# Patient Record
Sex: Male | Born: 1945
Health system: Southern US, Community
[De-identification: ages and names within clinical notes are randomized; demographics above are authoritative.]

## PROBLEM LIST (undated history)

## (undated) DIAGNOSIS — E785 Hyperlipidemia, unspecified: Secondary | ICD-10-CM

## (undated) DIAGNOSIS — Z803 Family history of malignant neoplasm of breast: Secondary | ICD-10-CM

## (undated) DIAGNOSIS — M1990 Unspecified osteoarthritis, unspecified site: Secondary | ICD-10-CM

## (undated) DIAGNOSIS — C61 Malignant neoplasm of prostate: Secondary | ICD-10-CM

## (undated) DIAGNOSIS — D72829 Elevated white blood cell count, unspecified: Secondary | ICD-10-CM

## (undated) DIAGNOSIS — H52209 Unspecified astigmatism, unspecified eye: Secondary | ICD-10-CM

## (undated) DIAGNOSIS — I10 Essential (primary) hypertension: Secondary | ICD-10-CM

## (undated) DIAGNOSIS — R945 Abnormal results of liver function studies: Secondary | ICD-10-CM

## (undated) DIAGNOSIS — H524 Presbyopia: Secondary | ICD-10-CM

## (undated) DIAGNOSIS — R7989 Other specified abnormal findings of blood chemistry: Secondary | ICD-10-CM

## (undated) DIAGNOSIS — G4733 Obstructive sleep apnea (adult) (pediatric): Secondary | ICD-10-CM

## (undated) HISTORY — DX: Elevated white blood cell count, unspecified: D72.829

## (undated) HISTORY — PX: APPENDECTOMY: SHX54

## (undated) HISTORY — PX: OTHER SURGICAL HISTORY: SHX169

## (undated) HISTORY — DX: Essential (primary) hypertension: I10

## (undated) HISTORY — DX: Unspecified osteoarthritis, unspecified site: M19.90

## (undated) HISTORY — PX: TONSILLECTOMY: SUR1361

## (undated) HISTORY — DX: Other specified abnormal findings of blood chemistry: R79.89

## (undated) HISTORY — DX: Family history of malignant neoplasm of breast: Z80.3

## (undated) HISTORY — DX: Obstructive sleep apnea (adult) (pediatric): G47.33

## (undated) HISTORY — PX: LUMBAR DISC SURGERY: SHX700

## (undated) HISTORY — DX: Abnormal results of liver function studies: R94.5

---

## 2006-01-10 ENCOUNTER — Emergency Department (HOSPITAL_COMMUNITY): Admission: EM | Admit: 2006-01-10 | Discharge: 2006-01-10 | Payer: Self-pay | Admitting: Emergency Medicine

## 2006-01-27 ENCOUNTER — Encounter: Admission: RE | Admit: 2006-01-27 | Discharge: 2006-01-27 | Payer: Self-pay | Admitting: Orthopedic Surgery

## 2006-02-12 ENCOUNTER — Encounter: Admission: RE | Admit: 2006-02-12 | Discharge: 2006-02-12 | Payer: Self-pay | Admitting: Specialist

## 2006-03-08 ENCOUNTER — Encounter: Admission: RE | Admit: 2006-03-08 | Discharge: 2006-03-08 | Payer: Self-pay | Admitting: Orthopedic Surgery

## 2006-03-30 ENCOUNTER — Ambulatory Visit (HOSPITAL_COMMUNITY): Admission: RE | Admit: 2006-03-30 | Discharge: 2006-03-31 | Payer: Self-pay | Admitting: Specialist

## 2006-07-11 ENCOUNTER — Encounter: Admission: RE | Admit: 2006-07-11 | Discharge: 2006-07-11 | Payer: Self-pay | Admitting: Specialist

## 2006-07-30 ENCOUNTER — Encounter: Admission: RE | Admit: 2006-07-30 | Discharge: 2006-07-30 | Payer: Self-pay | Admitting: Specialist

## 2006-08-27 ENCOUNTER — Encounter: Admission: RE | Admit: 2006-08-27 | Discharge: 2006-08-27 | Payer: Self-pay | Admitting: Specialist

## 2006-09-17 ENCOUNTER — Encounter: Admission: RE | Admit: 2006-09-17 | Discharge: 2006-09-17 | Payer: Self-pay | Admitting: Specialist

## 2007-07-30 ENCOUNTER — Emergency Department (HOSPITAL_COMMUNITY): Admission: EM | Admit: 2007-07-30 | Discharge: 2007-07-30 | Payer: Self-pay | Admitting: Emergency Medicine

## 2007-08-21 IMAGING — CR DG CHEST 2V
2 series · 2 of 2 positions shown · non-contrast
Comparison: None.

CLINICAL DATA: Preadmission for back surgery.
 CHEST ? 2 VIEW:

[view not recorded (1 of 2)]
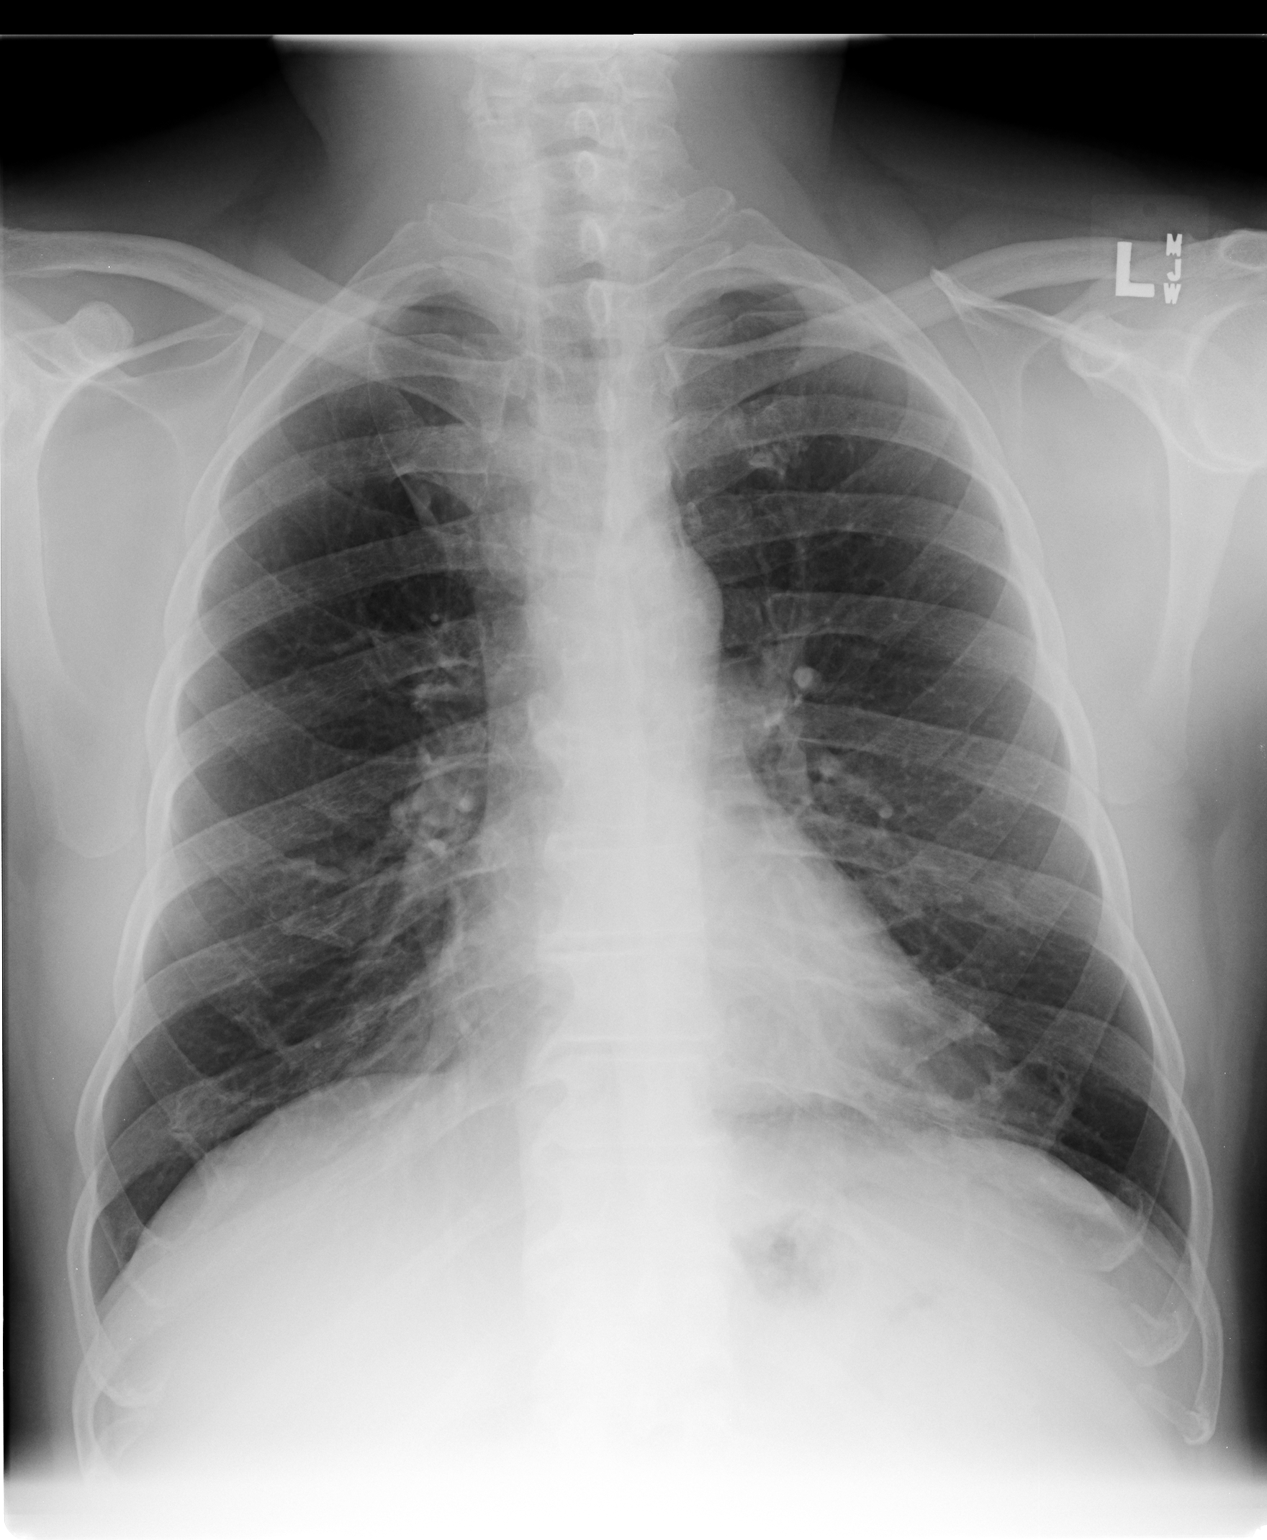

[view not recorded (2 of 2)]
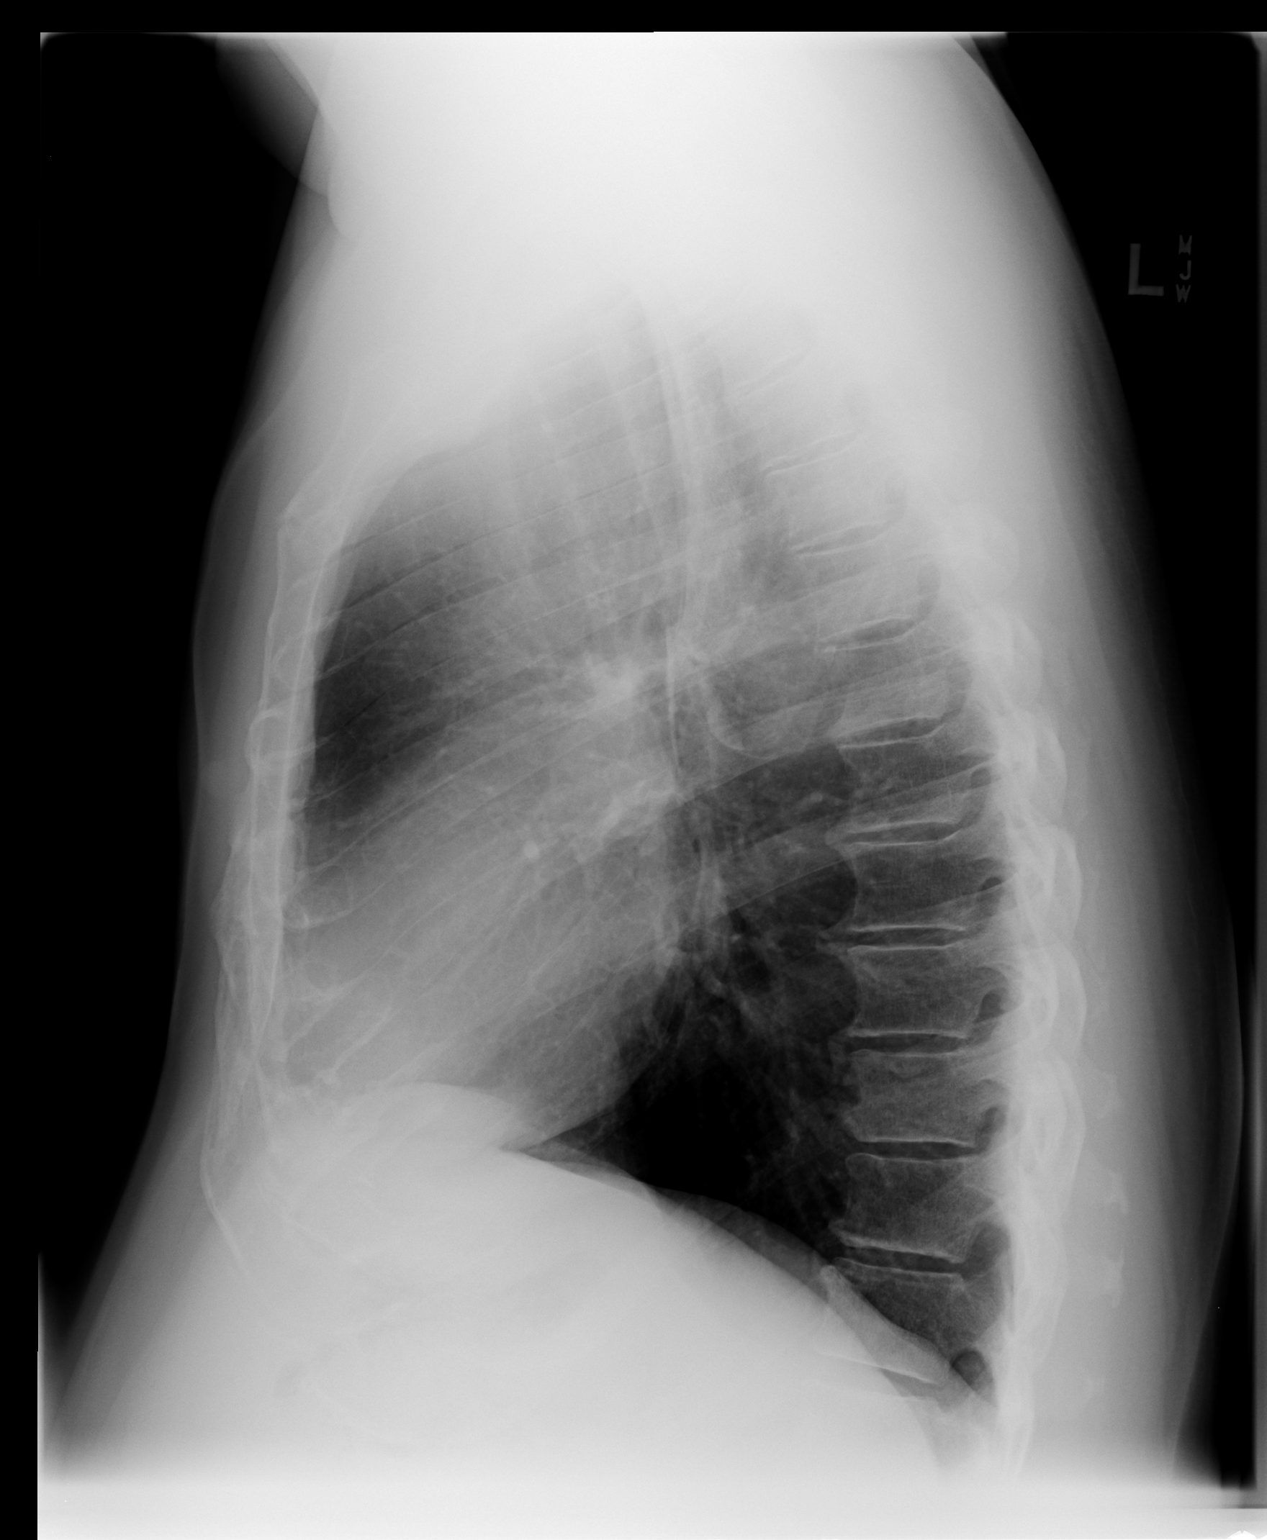

[2 of 2 positions shown; findings below may reference images not displayed]

FINDINGS: There is mild hyperinflation and changes of mild COPD.  There is no heart failure, infiltrate or effusion.  The lungs are clear.
IMPRESSION: Mild COPD.  No acute abnormality.

## 2010-08-29 NOTE — Op Note (Signed)
NAMEJONNIE, TRUXILLO NO.:  [id number]   MEDICAL RECORD NO.:  [id number]          PATIENT TYPE:  OIB   LOCATION:  5018                         FACILITY:  [facility]   PHYSICIAN:  Kerrin Champagne, M.D.   DATE OF BIRTH:  11/03/1945   DATE OF PROCEDURE:  03/30/2006  DATE OF DISCHARGE:  03/31/2006                               OPERATIVE REPORT   PREOPERATIVE DIAGNOSIS:  Herniated nucleus pulposus, central and right-  sided, L4-L5.   POSTOPERATIVE DIAGNOSIS:  Herniated nucleus pulposus, central and right-  sided, L4-L5.   PROCEDURE PERFORMED:  Microdiskectomy, right L4-L5, using the minimally-  invasive surgery system.   SURGEON:  Kerrin Champagne, M.D.   ASSISTANT:  Maud Deed, P.A.-C.   ANESTHESIA:  General via orotracheal intubation.  Dr. Krista Blue  supplemented with local infiltration of Marcaine 0.5% with 1:200,000  epinephrine 10 mL.   SPECIMENS:  HNP not sent for pathology.   ESTIMATED BLOOD LOSS:  10 mL.   COMPLICATIONS:  None.  The patient returned to the PACU in good  condition.   HISTORY OF PRESENT ILLNESS:  A 65 year old male with a 2-1/2 month  history of back pain and radiation to the right leg.  He has persistent  right foot dorsiflexion and weakness.  He has a positive straight leg  raise at 45 degrees.  MRI scan with HNP, central and right-sided L4-L5.  He has undergone conservative management, including physical therapy,  epidural steroids x3.  Persistent back pain.  He is continuing to  utilize narcotic medicines and is unable to return to work.  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  He was brought to the operating  room to undergo a right-sided L4-L5 microdiskectomy for the persistent  pain and disability, persistent radiculopathy.   INTRAOPERATIVE FINDINGS:  The patient had protrusion of  disk, central  and right-sided, a subligamentous and annular protrusion, but no free  fragments noted within the spinal canal.  The patient underwent  decompression in both the L4 and the L5 nerve roots.   DESCRIPTION OF PROCEDURE:  After adequate general anesthesia, the  patient on a Wilson frame, a reverse Skytron bed was used, as per C-arm  fluoro.  Standard preoperative antibiotics of Ancef, standard prep with  DuraPrep solution, draped in the usual manner.  The area for expected L4-  L5 level carefully marked out at the upper end of the patient's crest.  Spinal needle then placed approximately an inch to the right side of the  midline at expected L4-L5 level.  Intraoperative radiograph demonstrated  the needle over the right L4-L5 facet.  Along his central portion and  slightly medial.  Incision was then made using a 10-blade scalpel,  approximately an inch in length, extending from above and below the  needle.  Carried down through the subcutaneous layers and the  superficial lumbodorsal fascia incised in line with the skin incision.  Spinal needle  was removed, and then using the C-arm, the smallest  dilator was then used to dock to the facet at the right L4-L5 level.  This was then used to carefully dissect the soft tissues off the medial  aspect of the facet.  Dilation was then performed up to the 7-mm dilator  and to the largest dilator possible.  The MIS retractor 3-blade was then  passed over the dilators down to the level of the facet, and then  carefully expanded in order to expose the posterior aspect of the  interlaminar space at the L4-L5 level.  A Penfield 4 was then passed  along the medial aspect of the facet.  Intraoperative lateral radiograph  demonstrating the Penfield 4 at the facet at the L4-L5 level, directly  posterior to the L4-L5 disk.  With this, then the C-arm fluoro was  removed from the field.  A 3-mm Kerrison was then used to resect a small  portion of  the inferior aspect of the lamina of L4 on the right side,  and then off the medial aspect of the facet at the L4-L5 level.  The  superior insertion of the ligamentum flavum at the L4-L5 level into the  L4 inferior aspect of the lamina was then carefully freed, and this was  then resected using a 3-mm Kerrison.  This resected off the superior  aspect of the lamina of L5.  And also from the medial aspect of the  facet at the L4-L5 level.  Loupe magnification and headlamp was used for  this portion of the case.  Operating room microscope was then carefully  draped and brought into the field.  Under the operating room microscope  then the dura was carefully evaluated as well as the L5 nerve root  examined.  The lateral aspect of the thecal sac and the L5 nerve root  carefully retracted medially with a Penfield 4 and a D'Errico retractor  placed.  The patient's veins were carefully cauterized with bipolar  electrocautery over the posterior aspect of the disk space.  And the  disk was then carefully probed, found to be protruding posteriorly and  into the right lateral recess.  A 15-blade scalpel was used to incise  the disk and then pituitary rongeur used to excise the disk.  Removing  disk material with the patient's pituitaries with teeth using upbiting  and downbiting pituitaries until the disk had been well-decompressed.  Hockey-stick neuro probe then passed over the posterior aspect of the  disk space of the L4-L5 level, demonstrating that the central portion of  the canal had been well-decompressed.  A hockey-stick neuro probe could  be passed out the L4 and L5 neural foramen, demonstrating their  patencies, and there was no remaining herniated disk here.  Small  bleeders were controlled using bipolar electrocautery.   Irrigation was performed.  Thrombin-soaked Gelfoam was used to obtain  hemostasis.   Following further irrigation then evaluation, an Epstein curette was used to free  up further disk material in a subligamentous fashion, and  then this was resected using pituitaries again.  Irrigation of the disk  space carried out.  Once this was completed, then the spinal canal  demonstrated no active bleeding present.  Soft tissues were allowed to  fall back into place and the retractor was resected and removed.  Once  the retractor was removed, the soft tissues allowed to fall back into  place, the lumbodorsal fascia was reapproximated with interrupted 0  Vicryl sutures  with a UR-6 needle.  Subcu layer was approximated with  interrupted 0 Vicryl sutures.  The subcutaneous layer reapproximated  with an interrupted 2-0 Vicryl.  The skin closed with a running subcu  stitch of 4-0 Vicryl and Dermabond applied.  A 4x4 was affixed to the  skin with Hypafix tape.  Note that the patient had infiltration of the  skin subcutaneously with Marcaine 0.5% with 1:200,000 epinephrine at the  beginning of the case.  The patient was then returned to a supine  position, reactivated, extubated, and returned to the recovery room in  satisfactory condition.  All instrument and sponge counts were correct.      Kerrin Champagne, M.D.  Electronically Signed     JEN/MEDQ  D:  03/30/2006  T:  03/31/2006  Job:  [id number]

## 2011-11-13 ENCOUNTER — Telehealth: Payer: Self-pay | Admitting: *Deleted

## 2011-11-13 NOTE — Telephone Encounter (Signed)
PATIENT AND DAUGHTER CONFIRMED OVER THE PHONE THE NEW DATE AND TIME OF THE NEW PATIENT APPOINTMENT

## 2011-11-16 ENCOUNTER — Telehealth: Payer: Self-pay | Admitting: Oncology

## 2011-11-16 NOTE — Telephone Encounter (Signed)
Ref. Windle Guard Dx. Hematologist Del. 11/16/11

## 2011-11-18 ENCOUNTER — Telehealth: Payer: Self-pay | Admitting: Oncology

## 2011-11-18 ENCOUNTER — Ambulatory Visit: Payer: Self-pay

## 2011-11-18 ENCOUNTER — Other Ambulatory Visit: Payer: Self-pay

## 2011-11-18 ENCOUNTER — Ambulatory Visit (HOSPITAL_BASED_OUTPATIENT_CLINIC_OR_DEPARTMENT_OTHER): Payer: Self-pay | Admitting: Oncology

## 2011-11-18 ENCOUNTER — Other Ambulatory Visit: Payer: Self-pay | Admitting: *Deleted

## 2011-11-18 ENCOUNTER — Encounter: Payer: Self-pay | Admitting: Oncology

## 2011-11-18 DIAGNOSIS — R7989 Other specified abnormal findings of blood chemistry: Secondary | ICD-10-CM

## 2011-11-18 DIAGNOSIS — M545 Low back pain: Secondary | ICD-10-CM

## 2011-11-18 DIAGNOSIS — D72829 Elevated white blood cell count, unspecified: Secondary | ICD-10-CM | POA: Insufficient documentation

## 2011-11-18 DIAGNOSIS — R945 Abnormal results of liver function studies: Secondary | ICD-10-CM

## 2011-11-18 NOTE — Patient Instructions (Addendum)
I will see you back in 1 month.

## 2011-11-18 NOTE — Progress Notes (Signed)
[facility]  [phone] [phone]    INITIAL HEMATOLOGY CONSULTATION    Referral MD:   Dr. Jeannetta Nap  Reason for Referral: Evaluation for iron overload and leukocytosis    HPI: 66 year old gentleman with medical history significant for hypertension. Most recently he was seen by Dr. Jeannetta Nap and he had complaints of spasms of and cramping of the lower extremities fatigue. Laboratory data was performed patient was found to have abnormality in his LFTs with elevation in transaminases as well as GGT. He also had a CBC performed that showed an elevated white count to about 12. He was noted to have some monocytes on the peripheral smear. Because of this he was referred to hematology for further evaluation of hemochromatosis and leukocytosis. Patient has no family history of hemochromatosis or iron overload or any leukemias. Patient does complain of having some lower back pain no abdominal pain he also complains of having his legs have spasms periodically with jerking movements occasionally and cramping. He has never noticed any blood clots or swelling of his lower extremities. He is fatigued he sometimes does get short-winded but otherwise no fevers chills night sweats headaches no chest pains or palpitations. Patient has put himself on a low fat low cholesterol diet and he has lost about 11 pounds on this diet.   Past Medical History  Diagnosis Date  . Leukocytosis   . LFTs abnormal   . Hypertension   :    Past Surgical History  Procedure Date  . Back surgery   :   CURRENT MEDS: Current Outpatient Prescriptions  Medication Sig Dispense Refill  . aspirin 81 MG tablet Take 81 mg by mouth daily.      Marland Kitchen LISINOPRIL PO Take 20 mg by mouth daily.          No Known Allergies:  Family History  Problem Relation Age of Onset  . Cancer Mother   . Diabetes Sister   . Cancer Brother   :  History   Social History  . Marital Status: Single   Spouse Name: N/A    Number of Children: N/A  . Years of Education: N/A   Occupational History  . Not on file.   Social History Main Topics  . Smoking status: Never Smoker   . Smokeless tobacco: Never Used  . Alcohol Use: No  . Drug Use: No  . Sexually Active: Not Currently   Other Topics Concern  . Not on file   Social History Narrative  . No narrative on file  :  REVIEW OF SYSTEM:  The rest of the 14-point review of sytem was negative.   Exam: Filed Vitals:   11/18/11 1606  BP: 166/74  Pulse: 86  Temp: 98.4 F (36.9 C)  TempSrc: Oral  Resp: 20  Height: 5\' 3"  (1.6 m)  Weight: 179 lb 3.2 oz (81.285 kg)     General:  well-nourished in no acute distress.  Eyes:  no scleral icterus.  ENT:  There were no oropharyngeal lesions.  Neck was without thyromegaly.  Lymphatics:  Negative cervical, supraclavicular or axillary adenopathy.  Respiratory: lungs were clear bilaterally without wheezing or crackles.  Cardiovascular:  Regular rate and rhythm, S1/S2, without murmur, rub or gallop.  There was no pedal edema.  GI:  abdomen was soft, flat, nontender, nondistended, without organomegaly.  Muscoloskeletal:  no spinal tenderness of palpation of vertebral spine.  Skin exam was without echymosis, petichae.  Neuro exam was nonfocal.  Patient was able  to get on and off exam table without assistance.  Gait was normal.  Patient was alerted and oriented.  Attention was good.   Language was appropriate.  Mood was normal without depression.  Speech was not pressured.  Thought content was not tangential.    LABS:  No results found for this basename: WBC, HGB, HCT, PLT, GLUCOSE, CHOL, TRIG, HDL, LDLDIRECT, LDLCALC, ALT, AST, NA, K, CL, CREATININE, BUN, CO2, PSA, INR, GLUF, HGBA1C, MICROALBUR     ASSESSMENT AND PLAN: 66 year old gentleman being seen for abnormality in his LFTs with consideration for hemochromatosis. He also had elevated white count. Unfortunately I was not able to get his blood  Done today as he was my last patient in our lab was closed. I have recommended he return tomorrow for a full workup we would plan on doing CBC CMET, iron studies I will also obtain hemochromatosis gene mutation.  The length of time of the face-to-face encounter was 60    minutes. More than 50% of time was spent counseling and coordination of care.  Drue Second, MD Medical/Oncology [facility] [phone] (beeper) [phone] (Office)  11/18/2011, 5:09 PM

## 2011-11-18 NOTE — Progress Notes (Signed)
New patient today patient was accompanied by his daughter, patient has no insurance but patient will be applying for medicare in a few months, gave patient financial epp application, gave patient medicaid application. They stated that the probably will not fill out medicaid application. They will get epp application back as soon as possible.

## 2011-11-18 NOTE — Telephone Encounter (Signed)
gve the pt his lab appt for tomorrow. Pt is aware to pick up his schedule tomorrow.

## 2011-11-19 ENCOUNTER — Other Ambulatory Visit (HOSPITAL_BASED_OUTPATIENT_CLINIC_OR_DEPARTMENT_OTHER): Payer: Self-pay | Admitting: Lab

## 2011-11-19 DIAGNOSIS — D72829 Elevated white blood cell count, unspecified: Secondary | ICD-10-CM

## 2011-11-19 LAB — CBC WITH DIFFERENTIAL/PLATELET
Basophils Absolute: 0.1 10*3/uL (ref 0.0–0.1)
EOS%: 3.2 % (ref 0.0–7.0)
Eosinophils Absolute: 0.3 10*3/uL (ref 0.0–0.5)
HGB: 12.8 g/dL — ABNORMAL LOW (ref 13.0–17.1)
MCV: 88.6 fL (ref 79.3–98.0)
MONO%: 10.4 % (ref 0.0–14.0)
NEUT#: 3.9 10*3/uL (ref 1.5–6.5)
RBC: 4.36 10*6/uL (ref 4.20–5.82)
RDW: 16.9 % — ABNORMAL HIGH (ref 11.0–14.6)
lymph#: 3.6 10*3/uL — ABNORMAL HIGH (ref 0.9–3.3)

## 2011-11-22 LAB — COMPREHENSIVE METABOLIC PANEL
AST: 25 U/L (ref 0–37)
Albumin: 4 g/dL (ref 3.5–5.2)
Alkaline Phosphatase: 49 U/L (ref 39–117)
Calcium: 9 mg/dL (ref 8.4–10.5)
Chloride: 103 mEq/L (ref 96–112)
Glucose, Bld: 106 mg/dL — ABNORMAL HIGH (ref 70–99)
Potassium: 4.7 mEq/L (ref 3.5–5.3)
Sodium: 135 mEq/L (ref 135–145)
Total Protein: 6.7 g/dL (ref 6.0–8.3)

## 2011-11-22 LAB — HEMOCHROMATOSIS DNA-PCR(C282Y,H63D): DNA Mutation Analysis: NOT DETECTED

## 2011-11-23 ENCOUNTER — Telehealth: Payer: Self-pay | Admitting: Oncology

## 2011-11-23 NOTE — Telephone Encounter (Signed)
lmonvm adviisng the pt of his sept appt

## 2012-01-04 ENCOUNTER — Telehealth: Payer: Self-pay | Admitting: Oncology

## 2012-01-04 NOTE — Telephone Encounter (Signed)
lmonvm adviisng the pt of his r's sept appt to oct due to the md's schedule

## 2012-01-05 ENCOUNTER — Ambulatory Visit (HOSPITAL_BASED_OUTPATIENT_CLINIC_OR_DEPARTMENT_OTHER): Payer: Self-pay | Admitting: Oncology

## 2012-01-05 VITALS — BP 163/82 | HR 75 | Temp 98.7°F | Resp 20 | Ht 63.0 in | Wt 167.9 lb

## 2012-01-05 DIAGNOSIS — R945 Abnormal results of liver function studies: Secondary | ICD-10-CM

## 2012-01-05 DIAGNOSIS — R7989 Other specified abnormal findings of blood chemistry: Secondary | ICD-10-CM

## 2012-01-05 NOTE — Patient Instructions (Addendum)
Doing well  Your  test for hemochromatosis is negative

## 2012-01-06 ENCOUNTER — Ambulatory Visit: Payer: Self-pay | Admitting: Oncology

## 2012-01-07 ENCOUNTER — Telehealth: Payer: Self-pay | Admitting: *Deleted

## 2012-01-07 NOTE — Telephone Encounter (Signed)
as needed

## 2012-01-10 NOTE — Progress Notes (Signed)
  OFFICE PROGRESS NOTE  CC Dr. Jeannetta Nap  DIAGNOSIS: 66 year old gentleman being workup for possible hemochromatosis presenting with abnormal LFTs and fatigue  PRIOR THERAPY:None  CURRENT THERAPY:Observation  INTERVAL HISTORY: Gregory Ewing 66 y.o. male returns for Followup visit today to discuss his lab work. Clinically he seems to be doing well without any significant complaints. He is observing a very strict diet and he has lost some weight as well. He no longer is having any lower extremity cramping spasms and he denies having any fatigue. He has no bleeding problems.  MEDICAL HISTORY: Past Medical History  Diagnosis Date  . Leukocytosis   . LFTs abnormal   . Hypertension     ALLERGIES:   has no known allergies.  MEDICATIONS:  Current Outpatient Prescriptions  Medication Sig Dispense Refill  . aspirin 81 MG tablet Take 81 mg by mouth daily.      Marland Kitchen LISINOPRIL PO Take 20 mg by mouth daily.        SURGICAL HISTORY:  Past Surgical History  Procedure Date  . Back surgery     REVIEW OF SYSTEMS:  Pertinent items are noted in HPI.   PHYSICAL EXAMINATION: No exam was performed  ECOG PERFORMANCE STATUS: 0 - Asymptomatic  Blood pressure 163/82, pulse 75, temperature 98.7 F (37.1 C), temperature source Oral, resp. rate 20, height 5\' 3"  (1.6 m), weight 167 lb 14.4 oz (76.159 kg).  LABORATORY DATA: Lab Results  Component Value Date   WBC 8.8 11/19/2011   HGB 12.8* 11/19/2011   HCT 38.6 11/19/2011   MCV 88.6 11/19/2011   PLT 322 11/19/2011      Chemistry      Component Value Date/Time   NA 135 11/19/2011 1049   K 4.7 11/19/2011 1049   CL 103 11/19/2011 1049   CO2 24 11/19/2011 1049   BUN 16 11/19/2011 1049   CREATININE 1.29 11/19/2011 1049      Component Value Date/Time   CALCIUM 9.0 11/19/2011 1049   ALKPHOS 49 11/19/2011 1049   AST 25 11/19/2011 1049   ALT 32 11/19/2011 1049   BILITOT 0.4 11/19/2011 1049       RADIOGRAPHIC STUDIES:  No results found.  ASSESSMENT: 66 year old  gentleman with possible iron overload. I have reviewed all of the labs with the patient. He did have the DNA for hemochromatosis which is negative. We repeated his iron studies he has a slightly elevated ferritin to 395 which could be just an acute phase reactant. Total iron and percent saturation were all normal. He also had repeat LFTs performed they were all negative or normal. At this time there is no convincing evidence that patient in fact has iron overload. I have reassured the patient.   PLAN: Patient can continue to be observed by his primary care physician and I can see him on an as-needed basis.   All questions were answered. The patient knows to call the clinic with any problems, questions or concerns. We can certainly see the patient much sooner if necessary.  I spent 15 minutes counseling the patient face to face. The total time spent in the appointment was 30 minutes.   Drue Second, MD Medical/Oncology [facility] [phone] (beeper) [phone] (Office)

## 2012-02-10 ENCOUNTER — Ambulatory Visit: Payer: Self-pay | Admitting: Oncology

## 2015-06-14 ENCOUNTER — Ambulatory Visit
Admission: RE | Admit: 2015-06-14 | Discharge: 2015-06-14 | Disposition: A | Discharge status: Home / Self Care | Payer: Medicare HMO | Source: Ambulatory Visit | Attending: Family Medicine | Admitting: Family Medicine

## 2015-06-14 ENCOUNTER — Other Ambulatory Visit: Payer: Self-pay | Admitting: Family Medicine

## 2015-06-14 DIAGNOSIS — R05 Cough: Secondary | ICD-10-CM

## 2015-06-14 DIAGNOSIS — R059 Cough, unspecified: Secondary | ICD-10-CM

## 2016-12-08 ENCOUNTER — Encounter: Payer: Self-pay | Admitting: Radiation Oncology

## 2016-12-10 ENCOUNTER — Telehealth: Payer: Self-pay | Admitting: Radiation Oncology

## 2016-12-10 NOTE — Telephone Encounter (Signed)
Returned [employer] Explained her father will not receive radiation therapy on 12/23/16 but, will have a consult to discuss therapy. 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 She verbalized understanding.

## 2016-12-22 ENCOUNTER — Encounter: Payer: Self-pay | Admitting: Radiation Oncology

## 2016-12-22 DIAGNOSIS — C61 Malignant neoplasm of prostate: Secondary | ICD-10-CM | POA: Insufficient documentation

## 2016-12-22 NOTE — Progress Notes (Signed)
GU Location of Tumor / Histology: prostatic adenocarcinoma with lymph vascular invasion    If Prostate Cancer, Gleason Score is (4 + 5) and PSA is (72.800) on 09/11/16  Gregory Ewing had a prostate biopsy in 2013. PSA in 2014 was 14.6 but repeat biopsy was negative. Third biopsy done 09/25/17 revealed Gleason 4+5.   Biopsies of prostate (if applicable) revealed:    Past/Anticipated interventions by urology, if any: two prostate biopsies, PET, received initial  Eligard injection September 4th, patient scheduled to receive Eligard every three months, referral to radiation oncology  Past/Anticipated interventions by medical oncology, if any: None  Weight changes, if any: no  Bowel/Bladder complaints, if any: Urinary intermittency and incontinence. Nocturia x 3-5. Urinary urgency and frequency. Denies dysuria or hematuria. IPSS 14. Mild ED.  Nausea/Vomiting, if any: no  Pain issues, if any:  Intermittent pain bilateral knees, back and neck.   SAFETY ISSUES:  Prior radiation? no  Pacemaker/ICD? no  Possible current pregnancy? no  Is the patient on methotrexate? no  Current Complaints / other details:  71 year old male. Divorced. Retired. Suffering from recent death of oldest daughter. Reports having two bowel resections in 1970  PET revealed avid uptake in the prostate gland and the right obturator lymph node. Additional uptake in the left pelvic sidewall, left external iliac and right common iliac lymph nodes.

## 2016-12-23 ENCOUNTER — Ambulatory Visit
Admission: RE | Admit: 2016-12-23 | Discharge: 2016-12-23 | Disposition: A | Discharge status: Home / Self Care | Payer: Non-veteran care | Source: Ambulatory Visit | Attending: Radiation Oncology | Admitting: Radiation Oncology

## 2016-12-23 ENCOUNTER — Encounter: Payer: Self-pay | Admitting: Radiation Oncology

## 2016-12-23 DIAGNOSIS — Z833 Family history of diabetes mellitus: Secondary | ICD-10-CM | POA: Insufficient documentation

## 2016-12-23 DIAGNOSIS — Z510 Encounter for antineoplastic radiation therapy: Secondary | ICD-10-CM | POA: Insufficient documentation

## 2016-12-23 DIAGNOSIS — I10 Essential (primary) hypertension: Secondary | ICD-10-CM | POA: Insufficient documentation

## 2016-12-23 DIAGNOSIS — E785 Hyperlipidemia, unspecified: Secondary | ICD-10-CM | POA: Diagnosis not present

## 2016-12-23 DIAGNOSIS — R972 Elevated prostate specific antigen [PSA]: Secondary | ICD-10-CM | POA: Diagnosis not present

## 2016-12-23 DIAGNOSIS — Z800 Family history of malignant neoplasm of digestive organs: Secondary | ICD-10-CM | POA: Insufficient documentation

## 2016-12-23 DIAGNOSIS — Z7982 Long term (current) use of aspirin: Secondary | ICD-10-CM | POA: Diagnosis not present

## 2016-12-23 DIAGNOSIS — C61 Malignant neoplasm of prostate: Secondary | ICD-10-CM | POA: Insufficient documentation

## 2016-12-23 DIAGNOSIS — Z9889 Other specified postprocedural states: Secondary | ICD-10-CM | POA: Insufficient documentation

## 2016-12-23 DIAGNOSIS — N400 Enlarged prostate without lower urinary tract symptoms: Secondary | ICD-10-CM | POA: Diagnosis not present

## 2016-12-23 DIAGNOSIS — Z803 Family history of malignant neoplasm of breast: Secondary | ICD-10-CM | POA: Insufficient documentation

## 2016-12-23 DIAGNOSIS — C775 Secondary and unspecified malignant neoplasm of intrapelvic lymph nodes: Secondary | ICD-10-CM | POA: Diagnosis not present

## 2016-12-23 DIAGNOSIS — Z79899 Other long term (current) drug therapy: Secondary | ICD-10-CM | POA: Insufficient documentation

## 2016-12-23 DIAGNOSIS — N401 Enlarged prostate with lower urinary tract symptoms: Secondary | ICD-10-CM | POA: Diagnosis not present

## 2016-12-23 HISTORY — DX: Unspecified astigmatism, unspecified eye: H52.209

## 2016-12-23 HISTORY — DX: Malignant neoplasm of prostate: C61

## 2016-12-23 HISTORY — DX: Hyperlipidemia, unspecified: E78.5

## 2016-12-23 HISTORY — DX: Presbyopia: H52.4

## 2016-12-23 NOTE — Progress Notes (Signed)
Radiation Oncology         [phone] ________________________________  Initial Outpatient Consultation  Name: Gregory Ewing MRN: [id number]  Date: 12/23/2016  DOB: [birth year]  TK:ZSWFUX, Curt Jews, MD  Claire Shown, MD   REFERRING PHYSICIAN: Claire Shown, MD  DIAGNOSIS: 71 y.o. gentleman with Stage T2cN1M0 adenocarcinoma of the prostate with Gleason Score of 4+5, and PSA of 72.80.     ICD-10-CM   1. Malignant neoplasm of prostate ([place]) C61     HISTORY OF PRESENT ILLNESS: Gregory Ewing is a 71 y.o. male with a diagnosis of prostate cancer with perineural invasion and lymph node metastases. He was noted to have an elevated PSA of 7.61 by his primary care physician, Dr. Delorise Shiner at the [facility] in 2013.  Accordingly, he was referred for evaluation in urology by Dr. Butch Penny, where a digital rectal examination was performed at that time revealing no nodularity, symmetric lobes. He underwent transrectal ultrasound prostate biopsy on 03/25/2012 which was negative. His PSA continued to rise and measured 14.6 in November 2014. Repeat biopsy again was negative. Since then his follow-up PSAs have shown a steady rise and reached a high of 72.8 in June 2018. He was referred back to Dr. Amalia Hailey for further evaluation. He underwent a digital rectal exam and a third transrectal ultrasound with 12 biopsies of the prostate on 09/25/2016. DRE revealed a large prostate volume, measuring 120 cc, without nodules. Out of 12 core biopsies, 9 were positive. The maximum Gleason score was 4+5, and this was seen in 6 of multiple cores of the left prostate with perineural invasion present. Additionally, there was Gleason 3+4=7 disease in 3 of the 6 cores of the right prostate.   Subsequent Axumin PET scan on 11/03/2016 showed heterogeneous abnormal uptake in the prostate gland with avid uptake in the right obturator lymph node that was 1.1 cm with additional subcentimeter uptake in the left pelvic  sidewall, left external iliac, and right common iliac lymph nodes. There was no radiotracer uptake outside of the pelvis.    PSA trend: 09/11/16 PSA 72.8 07/07/16 PSA 63.3 03/03/13 PSA 14.6  09/12/12 PSA 9.55  01/13/12 PSA 7.61  He was referred for consultation with medical oncology at the [facility] with Dr. Roslyn Smiling who started him on ADT with Eligard 22.5 mg on 12/15/2016 to be given every three months, with consideration for adding Zytiga. He takes Flomax daily for BPH with BOO.  The patient reviewed the biopsy results with his urologist and he has kindly been referred today for discussion of potential radiation treatment options. He is accompanied by his daughter.  Of note, the patient underwent two partial SBRs for abscess in the early 1970s with approximately 1-2 feet of small bowel removed.   PREVIOUS RADIATION THERAPY: No  PAST MEDICAL HISTORY:  Past Medical History:  Diagnosis Date  . Astigmatism   . Hyperlipidemia   . Hypertension   . Leukocytosis   . LFTs abnormal   . Presbyopia   . Prostate cancer ([place])       PAST SURGICAL HISTORY: Past Surgical History:  Procedure Laterality Date  . APPENDECTOMY    . BACK SURGERY    . partial small bowel resection    . TONSILLECTOMY      FAMILY HISTORY:  Family History  Problem Relation Age of Onset  . Cancer Mother        breast  . Diabetes Sister   . Cancer Brother  throat/hx of smoking and drinking    SOCIAL HISTORY:  Social History   Social History  . Marital status: Single    Spouse name: N/A  . Number of children: N/A  . Years of education: N/A   Occupational History  . Not on file.   Social History Main Topics  . Smoking status: Never Smoker  . Smokeless tobacco: Never Used  . Alcohol use No  . Drug use: No  . Sexual activity: Not Currently   Other Topics Concern  . Not on file   Social History Narrative  . No narrative on file    ALLERGIES: Patient has no known allergies.  MEDICATIONS:   Current Outpatient Prescriptions  Medication Sig Dispense Refill  . aspirin 81 MG tablet Take 81 mg by mouth daily.    . Calcium Carb-Cholecalciferol (CALCIUM/VITAMIN D) 600-400 MG-UNIT TABS Take by mouth.    . cetirizine (ZYRTEC) 10 MG tablet Take 10 mg by mouth daily.    . citalopram (CELEXA) 40 MG tablet Take 40 mg by mouth daily. 1/2 tab daily    . lisinopril-hydrochlorothiazide (PRINZIDE,ZESTORETIC) 20-25 MG tablet Take 1 tablet by mouth daily.    Marland Kitchen lovastatin (MEVACOR) 20 MG tablet Take 20 mg by mouth at bedtime.    . NON FORMULARY B12 injection once a month    . NON FORMULARY Immune support OTC vitamin c 1000 mg daily    . tamsulosin (FLOMAX) 0.4 MG CAPS capsule Take 0.8 mg by mouth.      No current facility-administered medications for this encounter.     REVIEW OF SYSTEMS:  On review of systems, the patient reports that he is doing well overall. He denies any chest pain, shortness of breath, cough, fevers, chills, night sweats, or unintended weight changes. He denies any bowel disturbances, and denies abdominal pain, nausea or vomiting. He reports intermittent pain in his neck, back, and bilateral knees. His IPSS was 14, indicating moderate urinary symptoms, including urinary frequency, urgency, intermittency, incontinence, and nocturia x3-5. He takes Flomax daily. He denies dysuria or hematuria. He has mild ED. Unfortunately, he is currently suffering emotionally from the death of his oldest daughter, which has caused him immense stress along with his cancer diagnosis. A complete review of systems is obtained and is otherwise negative.    PHYSICAL EXAM:  Wt Readings from Last 3 Encounters:  12/23/16 173 lb (78.5 kg)  01/05/12 167 lb 14.4 oz (76.2 kg)  11/18/11 179 lb 3.2 oz (81.3 kg)   Temp Readings from Last 3 Encounters:  12/23/16 98 F (36.7 C) (Oral)  01/05/12 98.7 F (37.1 C) (Oral)  11/18/11 98.4 F (36.9 C) (Oral)   BP Readings from Last 3 Encounters:  12/23/16 (!)  144/70  01/05/12 (!) 163/82  11/18/11 (!) 166/74   Pulse Readings from Last 3 Encounters:  12/23/16 68  01/05/12 75  11/18/11 86   Pain Assessment Pain Score: 0-No pain/10  In general this is a well appearing caucasian male in no acute distress. He is alert and oriented x4 and appropriate throughout the examination. HEENT reveals that the patient is normocephalic, atraumatic. EOMs are intact. PERRLA. Skin is intact without any evidence of gross lesions. Cardiovascular exam reveals a regular rate and rhythm, no clicks rubs or murmurs are auscultated. Chest is clear to auscultation bilaterally. Lymphatic assessment is performed and does not reveal any adenopathy in the cervical, supraclavicular, axillary, or inguinal chains. Abdomen has active bowel sounds in all quadrants and is intact. The abdomen  is soft, non tender, non distended. Lower extremities are negative for deep calf tenderness, cyanosis or clubbing.  There is 1+ pretibial pitting edema bilaterally in the lower extremities.  KPS = 90  100 - Normal; no complaints; no evidence of disease. 90   - Able to carry on normal activity; minor signs or symptoms of disease. 80   - Normal activity with effort; some signs or symptoms of disease. 21   - Cares for self; unable to carry on normal activity or to do active work. 60   - Requires occasional assistance, but is able to care for most of his personal needs. 50   - Requires considerable assistance and frequent medical care. 75   - Disabled; requires special care and assistance. 3   - Severely disabled; hospital admission is indicated although death not imminent. 32   - Very sick; hospital admission necessary; active supportive treatment necessary. 10   - Moribund; fatal processes progressing rapidly. 0     - Dead  Karnofsky DA, Abelmann [place], Craver LS and Burchenal [facility] [phone]) The use of the nitrogen mustards in the palliative treatment of carcinoma: with particular reference to  bronchogenic carcinoma Cancer 1 634-56  LABORATORY DATA:  Lab Results  Component Value Date   WBC 8.8 11/19/2011   HGB 12.8 (L) 11/19/2011   HCT 38.6 11/19/2011   MCV 88.6 11/19/2011   PLT 322 11/19/2011   Lab Results  Component Value Date   NA 135 11/19/2011   K 4.7 11/19/2011   CL 103 11/19/2011   CO2 24 11/19/2011   Lab Results  Component Value Date   ALT 32 11/19/2011   AST 25 11/19/2011   ALKPHOS 49 11/19/2011   BILITOT 0.4 11/19/2011     RADIOGRAPHY: No results found.    IMPRESSION/PLAN: 1. 71 y.o. gentleman with Stage T2cN1M0 adenocarcinoma of the prostate with Gleason Score of 4+5, and PSA of 72.80. We discussed the patient's workup and outlined the nature of prostate cancer in this setting. The patient's Gleason's score, and PSA put him into the high risk group. Accordingly, he is eligible for a variety of potential treatment options including 8 weeks of external radiation in combination with ADT or external beam radiation with brachytherapy boost and ADT. We discussed the available radiation techniques, and focused on the details and logistics and delivery. We discussed and outlined the risks, benefits, short and long-term effects associated with radiotherapy and compared and contrasted these with prostatectomy. Given his abdominal surgical history, the patient would not be a good candidate for prostatectomy. Additionally, the patient would not be an ideal candidate for brachytherapy due to a prostate volume of 120 cc.  We detailed the role of ADT in the treatment of high risk prostate cancer and outlined the associated side effects that could be expected with this therapy.  He was recently started on ADT with Eligard 22.5 mg on 12/15/2016 by Dr. Roslyn Smiling at the [facility]. The is interested in proceeding with 8 weeks of EBRT in combination with the ADT.   At the end of our conversation, the patient elects to proceed with prostate IMRT. We anticipate beginning EBRT  approximately 8 weeks after the start of ADT. There is no need need for placement of gold fiducial markers due to recent Axumin PET imaging at [facility]. We will request these images be made available via [employer] to be fused with his planning CT scan.  We will share our findings with Dr. Roslyn Smiling and move forward with  scheduling a CT simulation and planning appointment for February 12, 2017 at 3:00 PM, with treatments to begin soon after.   We spent 50 minutes face to face with the patient and more than 50% of that time was spent in counseling and/or coordination of care.    Nicholos Johns, PA-C    Tyler Pita, MD  [facility] Direct Dial: [phone]  Fax: [phone] [email or web address]  Skype  LinkedIn  This document serves as a record of services personally performed by Tyler Pita, MD and Freeman Caldron, PA-C. It was created on their behalf by Rae Lips, a trained medical scribe. The creation of this record is based on the scribe's personal observations and the providers' statements to them. This document has been checked and approved by the attending providers.

## 2016-12-23 NOTE — Progress Notes (Signed)
See progress note under physician encounter. 

## 2016-12-31 ENCOUNTER — Telehealth: Payer: Self-pay | Admitting: Medical Oncology

## 2016-12-31 NOTE — Telephone Encounter (Signed)
Left a message to introduce myself as the prostate navigator. I was unable to meet him 9/12 when he consulted with Dr. Tammi Klippel. He is scheduled for CT simulation 01/12/17. I asked him to call me with questions or concerns.

## 2017-01-21 ENCOUNTER — Telehealth: Payer: Self-pay | Admitting: *Deleted

## 2017-01-21 NOTE — Telephone Encounter (Signed)
On 01-21-17 fax [facility] consult note to the [facility]

## 2017-02-12 ENCOUNTER — Ambulatory Visit
Admission: RE | Admit: 2017-02-12 | Discharge: 2017-02-12 | Disposition: A | Discharge status: Home / Self Care | Payer: Non-veteran care | Source: Ambulatory Visit | Attending: Radiation Oncology | Admitting: Radiation Oncology

## 2017-02-12 ENCOUNTER — Encounter: Payer: Self-pay | Admitting: Urology

## 2017-02-12 DIAGNOSIS — C61 Malignant neoplasm of prostate: Secondary | ICD-10-CM

## 2017-02-12 DIAGNOSIS — N400 Enlarged prostate without lower urinary tract symptoms: Secondary | ICD-10-CM | POA: Diagnosis not present

## 2017-02-12 DIAGNOSIS — Z803 Family history of malignant neoplasm of breast: Secondary | ICD-10-CM | POA: Diagnosis not present

## 2017-02-12 DIAGNOSIS — E785 Hyperlipidemia, unspecified: Secondary | ICD-10-CM | POA: Diagnosis not present

## 2017-02-12 DIAGNOSIS — Z833 Family history of diabetes mellitus: Secondary | ICD-10-CM | POA: Diagnosis not present

## 2017-02-12 DIAGNOSIS — Z510 Encounter for antineoplastic radiation therapy: Secondary | ICD-10-CM | POA: Diagnosis not present

## 2017-02-12 DIAGNOSIS — Z800 Family history of malignant neoplasm of digestive organs: Secondary | ICD-10-CM | POA: Diagnosis not present

## 2017-02-12 DIAGNOSIS — I10 Essential (primary) hypertension: Secondary | ICD-10-CM | POA: Diagnosis not present

## 2017-02-12 DIAGNOSIS — Z79899 Other long term (current) drug therapy: Secondary | ICD-10-CM | POA: Diagnosis not present

## 2017-02-12 DIAGNOSIS — Z9889 Other specified postprocedural states: Secondary | ICD-10-CM | POA: Diagnosis not present

## 2017-02-12 NOTE — Progress Notes (Signed)
Leonard Downing is working on getting the [place] from [facility] on 11/03/16 to be made available via [place].

## 2017-02-12 NOTE — Progress Notes (Signed)
  Radiation Oncology         [phone] ________________________________  Name: Gregory Ewing MRN: [id number]  Date: 02/12/2017  DOB: June 12, 1945  SIMULATION AND TREATMENT PLANNING NOTE    ICD-10-CM   1. Malignant neoplasm of prostate ([place]) C61     DIAGNOSIS:  71 y.o. gentleman with Stage T2cN1M0 adenocarcinoma of the prostate with Gleason Score of 4+5, and PSA of 72.80.   NARRATIVE:  The patient was brought to the [facility].  Identity was confirmed.  All relevant records and images related to the planned course of therapy were reviewed.  The patient freely provided informed written consent to proceed with treatment after reviewing the details related to the planned course of therapy. The consent form was witnessed and verified by the simulation staff.  Then, the patient was set-up in a stable reproducible supine position for radiation therapy.  A vacuum lock pillow device was custom fabricated to position his legs in a reproducible immobilized position.  Then, I performed a urethrogram under sterile conditions to identify the prostatic apex.  CT images were obtained.  Surface markings were placed.  The CT images were loaded into the planning software.  Then the prostate target and avoidance structures including the rectum, bladder, bowel and hips were contoured.  Treatment planning then occurred.  The radiation prescription was entered and confirmed.  A total of one complex treatment devices were fabricated. I have requested : Intensity Modulated Radiotherapy (IMRT) is medically necessary for this case for the following reason:  Rectal sparing.  PLAN:  The patient will receive 45 Gy in 25 fractions of 1.8 Gy, followed by a boost to the prostate to a total dose of 75 Gy with 15 additional fractions of 2.0 Gy.  ________________________________  Sheral Apley Tammi Klippel, M.D.  This document serves as a record of services personally performed by Tyler Pita, MD. It was created on  his behalf by Reola Mosher, a trained medical scribe. The creation of this record is based on the scribe's personal observations and the provider's statements to them. This document has been checked and approved by the attending provider.

## 2017-02-19 DIAGNOSIS — Z9889 Other specified postprocedural states: Secondary | ICD-10-CM | POA: Diagnosis not present

## 2017-02-19 DIAGNOSIS — N400 Enlarged prostate without lower urinary tract symptoms: Secondary | ICD-10-CM | POA: Diagnosis not present

## 2017-02-19 DIAGNOSIS — Z510 Encounter for antineoplastic radiation therapy: Secondary | ICD-10-CM | POA: Diagnosis not present

## 2017-02-19 DIAGNOSIS — E785 Hyperlipidemia, unspecified: Secondary | ICD-10-CM | POA: Diagnosis not present

## 2017-02-19 DIAGNOSIS — Z833 Family history of diabetes mellitus: Secondary | ICD-10-CM | POA: Diagnosis not present

## 2017-02-19 DIAGNOSIS — Z79899 Other long term (current) drug therapy: Secondary | ICD-10-CM | POA: Diagnosis not present

## 2017-02-19 DIAGNOSIS — Z803 Family history of malignant neoplasm of breast: Secondary | ICD-10-CM | POA: Diagnosis not present

## 2017-02-19 DIAGNOSIS — I10 Essential (primary) hypertension: Secondary | ICD-10-CM | POA: Diagnosis not present

## 2017-02-19 DIAGNOSIS — Z800 Family history of malignant neoplasm of digestive organs: Secondary | ICD-10-CM | POA: Diagnosis not present

## 2017-02-19 DIAGNOSIS — C61 Malignant neoplasm of prostate: Secondary | ICD-10-CM | POA: Diagnosis not present

## 2017-02-23 ENCOUNTER — Ambulatory Visit
Admission: RE | Admit: 2017-02-23 | Discharge: 2017-02-23 | Disposition: A | Discharge status: Home / Self Care | Payer: Non-veteran care | Source: Ambulatory Visit | Attending: Radiation Oncology | Admitting: Radiation Oncology

## 2017-02-23 DIAGNOSIS — I10 Essential (primary) hypertension: Secondary | ICD-10-CM | POA: Diagnosis not present

## 2017-02-23 DIAGNOSIS — Z79899 Other long term (current) drug therapy: Secondary | ICD-10-CM | POA: Diagnosis not present

## 2017-02-23 DIAGNOSIS — N400 Enlarged prostate without lower urinary tract symptoms: Secondary | ICD-10-CM | POA: Diagnosis not present

## 2017-02-23 DIAGNOSIS — Z833 Family history of diabetes mellitus: Secondary | ICD-10-CM | POA: Diagnosis not present

## 2017-02-23 DIAGNOSIS — C61 Malignant neoplasm of prostate: Secondary | ICD-10-CM | POA: Diagnosis not present

## 2017-02-23 DIAGNOSIS — Z803 Family history of malignant neoplasm of breast: Secondary | ICD-10-CM | POA: Diagnosis not present

## 2017-02-23 DIAGNOSIS — Z510 Encounter for antineoplastic radiation therapy: Secondary | ICD-10-CM | POA: Diagnosis not present

## 2017-02-23 DIAGNOSIS — Z800 Family history of malignant neoplasm of digestive organs: Secondary | ICD-10-CM | POA: Diagnosis not present

## 2017-02-23 DIAGNOSIS — Z9889 Other specified postprocedural states: Secondary | ICD-10-CM | POA: Diagnosis not present

## 2017-02-23 DIAGNOSIS — E785 Hyperlipidemia, unspecified: Secondary | ICD-10-CM | POA: Diagnosis not present

## 2017-02-24 ENCOUNTER — Ambulatory Visit
Admission: RE | Admit: 2017-02-24 | Discharge: 2017-02-24 | Disposition: A | Discharge status: Home / Self Care | Payer: Non-veteran care | Source: Ambulatory Visit | Attending: Radiation Oncology | Admitting: Radiation Oncology

## 2017-02-24 DIAGNOSIS — Z79899 Other long term (current) drug therapy: Secondary | ICD-10-CM | POA: Diagnosis not present

## 2017-02-24 DIAGNOSIS — E785 Hyperlipidemia, unspecified: Secondary | ICD-10-CM | POA: Diagnosis not present

## 2017-02-24 DIAGNOSIS — N400 Enlarged prostate without lower urinary tract symptoms: Secondary | ICD-10-CM | POA: Diagnosis not present

## 2017-02-24 DIAGNOSIS — Z833 Family history of diabetes mellitus: Secondary | ICD-10-CM | POA: Diagnosis not present

## 2017-02-24 DIAGNOSIS — Z510 Encounter for antineoplastic radiation therapy: Secondary | ICD-10-CM | POA: Diagnosis not present

## 2017-02-24 DIAGNOSIS — I10 Essential (primary) hypertension: Secondary | ICD-10-CM | POA: Diagnosis not present

## 2017-02-24 DIAGNOSIS — Z9889 Other specified postprocedural states: Secondary | ICD-10-CM | POA: Diagnosis not present

## 2017-02-24 DIAGNOSIS — Z803 Family history of malignant neoplasm of breast: Secondary | ICD-10-CM | POA: Diagnosis not present

## 2017-02-24 DIAGNOSIS — C61 Malignant neoplasm of prostate: Secondary | ICD-10-CM | POA: Diagnosis not present

## 2017-02-24 DIAGNOSIS — Z800 Family history of malignant neoplasm of digestive organs: Secondary | ICD-10-CM | POA: Diagnosis not present

## 2017-02-25 ENCOUNTER — Ambulatory Visit
Admission: RE | Admit: 2017-02-25 | Discharge: 2017-02-25 | Disposition: A | Discharge status: Home / Self Care | Payer: Non-veteran care | Source: Ambulatory Visit | Attending: Radiation Oncology | Admitting: Radiation Oncology

## 2017-02-25 DIAGNOSIS — Z833 Family history of diabetes mellitus: Secondary | ICD-10-CM | POA: Diagnosis not present

## 2017-02-25 DIAGNOSIS — C61 Malignant neoplasm of prostate: Secondary | ICD-10-CM | POA: Diagnosis not present

## 2017-02-25 DIAGNOSIS — Z803 Family history of malignant neoplasm of breast: Secondary | ICD-10-CM | POA: Diagnosis not present

## 2017-02-25 DIAGNOSIS — E785 Hyperlipidemia, unspecified: Secondary | ICD-10-CM | POA: Diagnosis not present

## 2017-02-25 DIAGNOSIS — Z9889 Other specified postprocedural states: Secondary | ICD-10-CM | POA: Diagnosis not present

## 2017-02-25 DIAGNOSIS — Z79899 Other long term (current) drug therapy: Secondary | ICD-10-CM | POA: Diagnosis not present

## 2017-02-25 DIAGNOSIS — I10 Essential (primary) hypertension: Secondary | ICD-10-CM | POA: Diagnosis not present

## 2017-02-25 DIAGNOSIS — Z510 Encounter for antineoplastic radiation therapy: Secondary | ICD-10-CM | POA: Diagnosis not present

## 2017-02-25 DIAGNOSIS — Z800 Family history of malignant neoplasm of digestive organs: Secondary | ICD-10-CM | POA: Diagnosis not present

## 2017-02-25 DIAGNOSIS — N400 Enlarged prostate without lower urinary tract symptoms: Secondary | ICD-10-CM | POA: Diagnosis not present

## 2017-02-26 ENCOUNTER — Ambulatory Visit
Admission: RE | Admit: 2017-02-26 | Discharge: 2017-02-26 | Disposition: A | Discharge status: Home / Self Care | Payer: Non-veteran care | Source: Ambulatory Visit | Attending: Radiation Oncology | Admitting: Radiation Oncology

## 2017-02-26 DIAGNOSIS — I10 Essential (primary) hypertension: Secondary | ICD-10-CM | POA: Diagnosis not present

## 2017-02-26 DIAGNOSIS — E785 Hyperlipidemia, unspecified: Secondary | ICD-10-CM | POA: Diagnosis not present

## 2017-02-26 DIAGNOSIS — Z803 Family history of malignant neoplasm of breast: Secondary | ICD-10-CM | POA: Diagnosis not present

## 2017-02-26 DIAGNOSIS — Z833 Family history of diabetes mellitus: Secondary | ICD-10-CM | POA: Diagnosis not present

## 2017-02-26 DIAGNOSIS — Z9889 Other specified postprocedural states: Secondary | ICD-10-CM | POA: Diagnosis not present

## 2017-02-26 DIAGNOSIS — Z800 Family history of malignant neoplasm of digestive organs: Secondary | ICD-10-CM | POA: Diagnosis not present

## 2017-02-26 DIAGNOSIS — Z79899 Other long term (current) drug therapy: Secondary | ICD-10-CM | POA: Diagnosis not present

## 2017-02-26 DIAGNOSIS — N400 Enlarged prostate without lower urinary tract symptoms: Secondary | ICD-10-CM | POA: Diagnosis not present

## 2017-02-26 DIAGNOSIS — Z510 Encounter for antineoplastic radiation therapy: Secondary | ICD-10-CM | POA: Diagnosis not present

## 2017-02-26 DIAGNOSIS — C61 Malignant neoplasm of prostate: Secondary | ICD-10-CM | POA: Diagnosis not present

## 2017-02-26 NOTE — Progress Notes (Signed)
Pt here for patient teaching.  Pt given Radiation and You booklet and skin care instructions.  Reviewed areas of pertinence such as diarrhea, fatigue, skin changes and urinary and bladder changes . Pt able to give teach back of have Imodium on hand and drink plenty of water,avoid applying anything to skin within 4 hours of treatment. Pt verbalizes understanding of information given and will contact nursing with any questions or concerns.     [email or web address]

## 2017-02-28 ENCOUNTER — Ambulatory Visit
Admission: RE | Admit: 2017-02-28 | Discharge: 2017-02-28 | Disposition: A | Discharge status: Home / Self Care | Payer: Non-veteran care | Source: Ambulatory Visit | Attending: Radiation Oncology | Admitting: Radiation Oncology

## 2017-02-28 DIAGNOSIS — N400 Enlarged prostate without lower urinary tract symptoms: Secondary | ICD-10-CM | POA: Diagnosis not present

## 2017-02-28 DIAGNOSIS — Z803 Family history of malignant neoplasm of breast: Secondary | ICD-10-CM | POA: Diagnosis not present

## 2017-02-28 DIAGNOSIS — Z9889 Other specified postprocedural states: Secondary | ICD-10-CM | POA: Diagnosis not present

## 2017-02-28 DIAGNOSIS — C61 Malignant neoplasm of prostate: Secondary | ICD-10-CM | POA: Diagnosis not present

## 2017-02-28 DIAGNOSIS — I10 Essential (primary) hypertension: Secondary | ICD-10-CM | POA: Diagnosis not present

## 2017-02-28 DIAGNOSIS — Z800 Family history of malignant neoplasm of digestive organs: Secondary | ICD-10-CM | POA: Diagnosis not present

## 2017-02-28 DIAGNOSIS — Z510 Encounter for antineoplastic radiation therapy: Secondary | ICD-10-CM | POA: Diagnosis not present

## 2017-02-28 DIAGNOSIS — Z79899 Other long term (current) drug therapy: Secondary | ICD-10-CM | POA: Diagnosis not present

## 2017-02-28 DIAGNOSIS — Z833 Family history of diabetes mellitus: Secondary | ICD-10-CM | POA: Diagnosis not present

## 2017-02-28 DIAGNOSIS — E785 Hyperlipidemia, unspecified: Secondary | ICD-10-CM | POA: Diagnosis not present

## 2017-03-01 ENCOUNTER — Ambulatory Visit
Admission: RE | Admit: 2017-03-01 | Discharge: 2017-03-01 | Disposition: A | Discharge status: Home / Self Care | Payer: Non-veteran care | Source: Ambulatory Visit | Attending: Radiation Oncology | Admitting: Radiation Oncology

## 2017-03-01 DIAGNOSIS — I10 Essential (primary) hypertension: Secondary | ICD-10-CM | POA: Diagnosis not present

## 2017-03-01 DIAGNOSIS — Z803 Family history of malignant neoplasm of breast: Secondary | ICD-10-CM | POA: Diagnosis not present

## 2017-03-01 DIAGNOSIS — Z79899 Other long term (current) drug therapy: Secondary | ICD-10-CM | POA: Diagnosis not present

## 2017-03-01 DIAGNOSIS — C61 Malignant neoplasm of prostate: Secondary | ICD-10-CM | POA: Diagnosis not present

## 2017-03-01 DIAGNOSIS — Z833 Family history of diabetes mellitus: Secondary | ICD-10-CM | POA: Diagnosis not present

## 2017-03-01 DIAGNOSIS — N400 Enlarged prostate without lower urinary tract symptoms: Secondary | ICD-10-CM | POA: Diagnosis not present

## 2017-03-01 DIAGNOSIS — Z9889 Other specified postprocedural states: Secondary | ICD-10-CM | POA: Diagnosis not present

## 2017-03-01 DIAGNOSIS — E785 Hyperlipidemia, unspecified: Secondary | ICD-10-CM | POA: Diagnosis not present

## 2017-03-01 DIAGNOSIS — Z510 Encounter for antineoplastic radiation therapy: Secondary | ICD-10-CM | POA: Diagnosis not present

## 2017-03-01 DIAGNOSIS — Z800 Family history of malignant neoplasm of digestive organs: Secondary | ICD-10-CM | POA: Diagnosis not present

## 2017-03-02 ENCOUNTER — Ambulatory Visit
Admission: RE | Admit: 2017-03-02 | Discharge: 2017-03-02 | Disposition: A | Discharge status: Home / Self Care | Payer: Non-veteran care | Source: Ambulatory Visit | Attending: Radiation Oncology | Admitting: Radiation Oncology

## 2017-03-02 DIAGNOSIS — I10 Essential (primary) hypertension: Secondary | ICD-10-CM | POA: Diagnosis not present

## 2017-03-02 DIAGNOSIS — Z510 Encounter for antineoplastic radiation therapy: Secondary | ICD-10-CM | POA: Diagnosis not present

## 2017-03-02 DIAGNOSIS — Z803 Family history of malignant neoplasm of breast: Secondary | ICD-10-CM | POA: Diagnosis not present

## 2017-03-02 DIAGNOSIS — N400 Enlarged prostate without lower urinary tract symptoms: Secondary | ICD-10-CM | POA: Diagnosis not present

## 2017-03-02 DIAGNOSIS — Z833 Family history of diabetes mellitus: Secondary | ICD-10-CM | POA: Diagnosis not present

## 2017-03-02 DIAGNOSIS — C61 Malignant neoplasm of prostate: Secondary | ICD-10-CM | POA: Diagnosis not present

## 2017-03-02 DIAGNOSIS — E785 Hyperlipidemia, unspecified: Secondary | ICD-10-CM | POA: Diagnosis not present

## 2017-03-02 DIAGNOSIS — Z79899 Other long term (current) drug therapy: Secondary | ICD-10-CM | POA: Diagnosis not present

## 2017-03-02 DIAGNOSIS — Z9889 Other specified postprocedural states: Secondary | ICD-10-CM | POA: Diagnosis not present

## 2017-03-02 DIAGNOSIS — Z800 Family history of malignant neoplasm of digestive organs: Secondary | ICD-10-CM | POA: Diagnosis not present

## 2017-03-03 ENCOUNTER — Ambulatory Visit
Admission: RE | Admit: 2017-03-03 | Discharge: 2017-03-03 | Disposition: A | Discharge status: Home / Self Care | Payer: Non-veteran care | Source: Ambulatory Visit | Attending: Radiation Oncology | Admitting: Radiation Oncology

## 2017-03-03 DIAGNOSIS — C61 Malignant neoplasm of prostate: Secondary | ICD-10-CM | POA: Diagnosis not present

## 2017-03-03 DIAGNOSIS — Z510 Encounter for antineoplastic radiation therapy: Secondary | ICD-10-CM | POA: Diagnosis not present

## 2017-03-03 DIAGNOSIS — Z803 Family history of malignant neoplasm of breast: Secondary | ICD-10-CM | POA: Diagnosis not present

## 2017-03-03 DIAGNOSIS — N400 Enlarged prostate without lower urinary tract symptoms: Secondary | ICD-10-CM | POA: Diagnosis not present

## 2017-03-03 DIAGNOSIS — Z833 Family history of diabetes mellitus: Secondary | ICD-10-CM | POA: Diagnosis not present

## 2017-03-03 DIAGNOSIS — Z800 Family history of malignant neoplasm of digestive organs: Secondary | ICD-10-CM | POA: Diagnosis not present

## 2017-03-03 DIAGNOSIS — Z9889 Other specified postprocedural states: Secondary | ICD-10-CM | POA: Diagnosis not present

## 2017-03-03 DIAGNOSIS — E785 Hyperlipidemia, unspecified: Secondary | ICD-10-CM | POA: Diagnosis not present

## 2017-03-03 DIAGNOSIS — Z79899 Other long term (current) drug therapy: Secondary | ICD-10-CM | POA: Diagnosis not present

## 2017-03-03 DIAGNOSIS — I10 Essential (primary) hypertension: Secondary | ICD-10-CM | POA: Diagnosis not present

## 2017-03-08 ENCOUNTER — Ambulatory Visit
Admission: RE | Admit: 2017-03-08 | Discharge: 2017-03-08 | Disposition: A | Discharge status: Home / Self Care | Payer: Non-veteran care | Source: Ambulatory Visit | Attending: Radiation Oncology | Admitting: Radiation Oncology

## 2017-03-08 ENCOUNTER — Encounter: Payer: Self-pay | Admitting: Medical Oncology

## 2017-03-08 DIAGNOSIS — Z510 Encounter for antineoplastic radiation therapy: Secondary | ICD-10-CM | POA: Diagnosis not present

## 2017-03-08 DIAGNOSIS — C61 Malignant neoplasm of prostate: Secondary | ICD-10-CM | POA: Diagnosis not present

## 2017-03-08 DIAGNOSIS — E785 Hyperlipidemia, unspecified: Secondary | ICD-10-CM | POA: Diagnosis not present

## 2017-03-08 DIAGNOSIS — Z9889 Other specified postprocedural states: Secondary | ICD-10-CM | POA: Diagnosis not present

## 2017-03-08 DIAGNOSIS — N400 Enlarged prostate without lower urinary tract symptoms: Secondary | ICD-10-CM | POA: Diagnosis not present

## 2017-03-08 DIAGNOSIS — I10 Essential (primary) hypertension: Secondary | ICD-10-CM | POA: Diagnosis not present

## 2017-03-08 DIAGNOSIS — Z803 Family history of malignant neoplasm of breast: Secondary | ICD-10-CM | POA: Diagnosis not present

## 2017-03-08 DIAGNOSIS — Z833 Family history of diabetes mellitus: Secondary | ICD-10-CM | POA: Diagnosis not present

## 2017-03-08 DIAGNOSIS — Z800 Family history of malignant neoplasm of digestive organs: Secondary | ICD-10-CM | POA: Diagnosis not present

## 2017-03-08 DIAGNOSIS — Z79899 Other long term (current) drug therapy: Secondary | ICD-10-CM | POA: Diagnosis not present

## 2017-03-09 ENCOUNTER — Ambulatory Visit
Admission: RE | Admit: 2017-03-09 | Discharge: 2017-03-09 | Disposition: A | Discharge status: Home / Self Care | Payer: Non-veteran care | Source: Ambulatory Visit | Attending: Radiation Oncology | Admitting: Radiation Oncology

## 2017-03-09 DIAGNOSIS — Z79899 Other long term (current) drug therapy: Secondary | ICD-10-CM | POA: Diagnosis not present

## 2017-03-09 DIAGNOSIS — Z510 Encounter for antineoplastic radiation therapy: Secondary | ICD-10-CM | POA: Diagnosis not present

## 2017-03-09 DIAGNOSIS — N400 Enlarged prostate without lower urinary tract symptoms: Secondary | ICD-10-CM | POA: Diagnosis not present

## 2017-03-09 DIAGNOSIS — C61 Malignant neoplasm of prostate: Secondary | ICD-10-CM | POA: Diagnosis not present

## 2017-03-09 DIAGNOSIS — E785 Hyperlipidemia, unspecified: Secondary | ICD-10-CM | POA: Diagnosis not present

## 2017-03-09 DIAGNOSIS — Z803 Family history of malignant neoplasm of breast: Secondary | ICD-10-CM | POA: Diagnosis not present

## 2017-03-09 DIAGNOSIS — I10 Essential (primary) hypertension: Secondary | ICD-10-CM | POA: Diagnosis not present

## 2017-03-09 DIAGNOSIS — Z800 Family history of malignant neoplasm of digestive organs: Secondary | ICD-10-CM | POA: Diagnosis not present

## 2017-03-09 DIAGNOSIS — Z833 Family history of diabetes mellitus: Secondary | ICD-10-CM | POA: Diagnosis not present

## 2017-03-09 DIAGNOSIS — Z9889 Other specified postprocedural states: Secondary | ICD-10-CM | POA: Diagnosis not present

## 2017-03-10 ENCOUNTER — Ambulatory Visit
Admission: RE | Admit: 2017-03-10 | Discharge: 2017-03-10 | Disposition: A | Discharge status: Home / Self Care | Payer: Non-veteran care | Source: Ambulatory Visit | Attending: Radiation Oncology | Admitting: Radiation Oncology

## 2017-03-10 DIAGNOSIS — Z833 Family history of diabetes mellitus: Secondary | ICD-10-CM | POA: Diagnosis not present

## 2017-03-10 DIAGNOSIS — N400 Enlarged prostate without lower urinary tract symptoms: Secondary | ICD-10-CM | POA: Diagnosis not present

## 2017-03-10 DIAGNOSIS — Z800 Family history of malignant neoplasm of digestive organs: Secondary | ICD-10-CM | POA: Diagnosis not present

## 2017-03-10 DIAGNOSIS — Z79899 Other long term (current) drug therapy: Secondary | ICD-10-CM | POA: Diagnosis not present

## 2017-03-10 DIAGNOSIS — I10 Essential (primary) hypertension: Secondary | ICD-10-CM | POA: Diagnosis not present

## 2017-03-10 DIAGNOSIS — Z803 Family history of malignant neoplasm of breast: Secondary | ICD-10-CM | POA: Diagnosis not present

## 2017-03-10 DIAGNOSIS — Z9889 Other specified postprocedural states: Secondary | ICD-10-CM | POA: Diagnosis not present

## 2017-03-10 DIAGNOSIS — C61 Malignant neoplasm of prostate: Secondary | ICD-10-CM | POA: Diagnosis not present

## 2017-03-10 DIAGNOSIS — Z510 Encounter for antineoplastic radiation therapy: Secondary | ICD-10-CM | POA: Diagnosis not present

## 2017-03-10 DIAGNOSIS — E785 Hyperlipidemia, unspecified: Secondary | ICD-10-CM | POA: Diagnosis not present

## 2017-03-11 ENCOUNTER — Ambulatory Visit
Admission: RE | Admit: 2017-03-11 | Discharge: 2017-03-11 | Disposition: A | Discharge status: Home / Self Care | Payer: Non-veteran care | Source: Ambulatory Visit | Attending: Radiation Oncology | Admitting: Radiation Oncology

## 2017-03-11 DIAGNOSIS — Z9889 Other specified postprocedural states: Secondary | ICD-10-CM | POA: Diagnosis not present

## 2017-03-11 DIAGNOSIS — I10 Essential (primary) hypertension: Secondary | ICD-10-CM | POA: Diagnosis not present

## 2017-03-11 DIAGNOSIS — Z833 Family history of diabetes mellitus: Secondary | ICD-10-CM | POA: Diagnosis not present

## 2017-03-11 DIAGNOSIS — Z510 Encounter for antineoplastic radiation therapy: Secondary | ICD-10-CM | POA: Diagnosis not present

## 2017-03-11 DIAGNOSIS — N400 Enlarged prostate without lower urinary tract symptoms: Secondary | ICD-10-CM | POA: Diagnosis not present

## 2017-03-11 DIAGNOSIS — E785 Hyperlipidemia, unspecified: Secondary | ICD-10-CM | POA: Diagnosis not present

## 2017-03-11 DIAGNOSIS — Z803 Family history of malignant neoplasm of breast: Secondary | ICD-10-CM | POA: Diagnosis not present

## 2017-03-11 DIAGNOSIS — Z800 Family history of malignant neoplasm of digestive organs: Secondary | ICD-10-CM | POA: Diagnosis not present

## 2017-03-11 DIAGNOSIS — C61 Malignant neoplasm of prostate: Secondary | ICD-10-CM | POA: Diagnosis not present

## 2017-03-11 DIAGNOSIS — Z79899 Other long term (current) drug therapy: Secondary | ICD-10-CM | POA: Diagnosis not present

## 2017-03-12 ENCOUNTER — Ambulatory Visit
Admission: RE | Admit: 2017-03-12 | Discharge: 2017-03-12 | Disposition: A | Discharge status: Home / Self Care | Payer: Non-veteran care | Source: Ambulatory Visit | Attending: Radiation Oncology | Admitting: Radiation Oncology

## 2017-03-12 DIAGNOSIS — N400 Enlarged prostate without lower urinary tract symptoms: Secondary | ICD-10-CM | POA: Diagnosis not present

## 2017-03-12 DIAGNOSIS — C61 Malignant neoplasm of prostate: Secondary | ICD-10-CM | POA: Diagnosis not present

## 2017-03-12 DIAGNOSIS — Z800 Family history of malignant neoplasm of digestive organs: Secondary | ICD-10-CM | POA: Diagnosis not present

## 2017-03-12 DIAGNOSIS — Z510 Encounter for antineoplastic radiation therapy: Secondary | ICD-10-CM | POA: Diagnosis not present

## 2017-03-12 DIAGNOSIS — I10 Essential (primary) hypertension: Secondary | ICD-10-CM | POA: Diagnosis not present

## 2017-03-12 DIAGNOSIS — E785 Hyperlipidemia, unspecified: Secondary | ICD-10-CM | POA: Diagnosis not present

## 2017-03-12 DIAGNOSIS — Z9889 Other specified postprocedural states: Secondary | ICD-10-CM | POA: Diagnosis not present

## 2017-03-12 DIAGNOSIS — Z803 Family history of malignant neoplasm of breast: Secondary | ICD-10-CM | POA: Diagnosis not present

## 2017-03-12 DIAGNOSIS — Z79899 Other long term (current) drug therapy: Secondary | ICD-10-CM | POA: Diagnosis not present

## 2017-03-12 DIAGNOSIS — Z833 Family history of diabetes mellitus: Secondary | ICD-10-CM | POA: Diagnosis not present

## 2017-03-15 ENCOUNTER — Ambulatory Visit
Admission: RE | Admit: 2017-03-15 | Discharge: 2017-03-15 | Disposition: A | Discharge status: Home / Self Care | Payer: Non-veteran care | Source: Ambulatory Visit | Attending: Radiation Oncology | Admitting: Radiation Oncology

## 2017-03-15 DIAGNOSIS — Z803 Family history of malignant neoplasm of breast: Secondary | ICD-10-CM | POA: Diagnosis not present

## 2017-03-15 DIAGNOSIS — N400 Enlarged prostate without lower urinary tract symptoms: Secondary | ICD-10-CM | POA: Diagnosis not present

## 2017-03-15 DIAGNOSIS — E785 Hyperlipidemia, unspecified: Secondary | ICD-10-CM | POA: Diagnosis not present

## 2017-03-15 DIAGNOSIS — Z9889 Other specified postprocedural states: Secondary | ICD-10-CM | POA: Diagnosis not present

## 2017-03-15 DIAGNOSIS — Z833 Family history of diabetes mellitus: Secondary | ICD-10-CM | POA: Diagnosis not present

## 2017-03-15 DIAGNOSIS — Z79899 Other long term (current) drug therapy: Secondary | ICD-10-CM | POA: Diagnosis not present

## 2017-03-15 DIAGNOSIS — Z510 Encounter for antineoplastic radiation therapy: Secondary | ICD-10-CM | POA: Diagnosis not present

## 2017-03-15 DIAGNOSIS — Z800 Family history of malignant neoplasm of digestive organs: Secondary | ICD-10-CM | POA: Diagnosis not present

## 2017-03-15 DIAGNOSIS — I10 Essential (primary) hypertension: Secondary | ICD-10-CM | POA: Diagnosis not present

## 2017-03-15 DIAGNOSIS — C61 Malignant neoplasm of prostate: Secondary | ICD-10-CM | POA: Diagnosis not present

## 2017-03-16 ENCOUNTER — Ambulatory Visit
Admission: RE | Admit: 2017-03-16 | Discharge: 2017-03-16 | Disposition: A | Discharge status: Home / Self Care | Payer: Non-veteran care | Source: Ambulatory Visit | Attending: Radiation Oncology | Admitting: Radiation Oncology

## 2017-03-16 DIAGNOSIS — I10 Essential (primary) hypertension: Secondary | ICD-10-CM | POA: Diagnosis not present

## 2017-03-16 DIAGNOSIS — N400 Enlarged prostate without lower urinary tract symptoms: Secondary | ICD-10-CM | POA: Diagnosis not present

## 2017-03-16 DIAGNOSIS — Z510 Encounter for antineoplastic radiation therapy: Secondary | ICD-10-CM | POA: Diagnosis not present

## 2017-03-16 DIAGNOSIS — Z79899 Other long term (current) drug therapy: Secondary | ICD-10-CM | POA: Diagnosis not present

## 2017-03-16 DIAGNOSIS — Z803 Family history of malignant neoplasm of breast: Secondary | ICD-10-CM | POA: Diagnosis not present

## 2017-03-16 DIAGNOSIS — Z800 Family history of malignant neoplasm of digestive organs: Secondary | ICD-10-CM | POA: Diagnosis not present

## 2017-03-16 DIAGNOSIS — C61 Malignant neoplasm of prostate: Secondary | ICD-10-CM | POA: Diagnosis not present

## 2017-03-16 DIAGNOSIS — Z833 Family history of diabetes mellitus: Secondary | ICD-10-CM | POA: Diagnosis not present

## 2017-03-16 DIAGNOSIS — E785 Hyperlipidemia, unspecified: Secondary | ICD-10-CM | POA: Diagnosis not present

## 2017-03-16 DIAGNOSIS — Z9889 Other specified postprocedural states: Secondary | ICD-10-CM | POA: Diagnosis not present

## 2017-03-17 ENCOUNTER — Ambulatory Visit
Admission: RE | Admit: 2017-03-17 | Discharge: 2017-03-17 | Disposition: A | Discharge status: Home / Self Care | Payer: Non-veteran care | Source: Ambulatory Visit | Attending: Radiation Oncology | Admitting: Radiation Oncology

## 2017-03-17 DIAGNOSIS — N400 Enlarged prostate without lower urinary tract symptoms: Secondary | ICD-10-CM | POA: Diagnosis not present

## 2017-03-17 DIAGNOSIS — Z510 Encounter for antineoplastic radiation therapy: Secondary | ICD-10-CM | POA: Diagnosis not present

## 2017-03-17 DIAGNOSIS — C61 Malignant neoplasm of prostate: Secondary | ICD-10-CM | POA: Diagnosis not present

## 2017-03-17 DIAGNOSIS — Z833 Family history of diabetes mellitus: Secondary | ICD-10-CM | POA: Diagnosis not present

## 2017-03-17 DIAGNOSIS — Z79899 Other long term (current) drug therapy: Secondary | ICD-10-CM | POA: Diagnosis not present

## 2017-03-17 DIAGNOSIS — Z9889 Other specified postprocedural states: Secondary | ICD-10-CM | POA: Diagnosis not present

## 2017-03-17 DIAGNOSIS — Z803 Family history of malignant neoplasm of breast: Secondary | ICD-10-CM | POA: Diagnosis not present

## 2017-03-17 DIAGNOSIS — Z800 Family history of malignant neoplasm of digestive organs: Secondary | ICD-10-CM | POA: Diagnosis not present

## 2017-03-17 DIAGNOSIS — I10 Essential (primary) hypertension: Secondary | ICD-10-CM | POA: Diagnosis not present

## 2017-03-17 DIAGNOSIS — E785 Hyperlipidemia, unspecified: Secondary | ICD-10-CM | POA: Diagnosis not present

## 2017-03-18 ENCOUNTER — Ambulatory Visit
Admission: RE | Admit: 2017-03-18 | Discharge: 2017-03-18 | Disposition: A | Discharge status: Home / Self Care | Payer: Non-veteran care | Source: Ambulatory Visit | Attending: Radiation Oncology | Admitting: Radiation Oncology

## 2017-03-18 DIAGNOSIS — I10 Essential (primary) hypertension: Secondary | ICD-10-CM | POA: Diagnosis not present

## 2017-03-18 DIAGNOSIS — Z9889 Other specified postprocedural states: Secondary | ICD-10-CM | POA: Diagnosis not present

## 2017-03-18 DIAGNOSIS — C61 Malignant neoplasm of prostate: Secondary | ICD-10-CM | POA: Diagnosis not present

## 2017-03-18 DIAGNOSIS — Z79899 Other long term (current) drug therapy: Secondary | ICD-10-CM | POA: Diagnosis not present

## 2017-03-18 DIAGNOSIS — Z833 Family history of diabetes mellitus: Secondary | ICD-10-CM | POA: Diagnosis not present

## 2017-03-18 DIAGNOSIS — Z803 Family history of malignant neoplasm of breast: Secondary | ICD-10-CM | POA: Diagnosis not present

## 2017-03-18 DIAGNOSIS — N400 Enlarged prostate without lower urinary tract symptoms: Secondary | ICD-10-CM | POA: Diagnosis not present

## 2017-03-18 DIAGNOSIS — Z510 Encounter for antineoplastic radiation therapy: Secondary | ICD-10-CM | POA: Diagnosis not present

## 2017-03-18 DIAGNOSIS — E785 Hyperlipidemia, unspecified: Secondary | ICD-10-CM | POA: Diagnosis not present

## 2017-03-18 DIAGNOSIS — Z800 Family history of malignant neoplasm of digestive organs: Secondary | ICD-10-CM | POA: Diagnosis not present

## 2017-03-19 ENCOUNTER — Ambulatory Visit
Admission: RE | Admit: 2017-03-19 | Discharge: 2017-03-19 | Disposition: A | Discharge status: Home / Self Care | Payer: Non-veteran care | Source: Ambulatory Visit | Attending: Radiation Oncology | Admitting: Radiation Oncology

## 2017-03-19 DIAGNOSIS — I10 Essential (primary) hypertension: Secondary | ICD-10-CM | POA: Diagnosis not present

## 2017-03-19 DIAGNOSIS — E785 Hyperlipidemia, unspecified: Secondary | ICD-10-CM | POA: Diagnosis not present

## 2017-03-19 DIAGNOSIS — C61 Malignant neoplasm of prostate: Secondary | ICD-10-CM | POA: Diagnosis not present

## 2017-03-19 DIAGNOSIS — Z79899 Other long term (current) drug therapy: Secondary | ICD-10-CM | POA: Diagnosis not present

## 2017-03-19 DIAGNOSIS — Z510 Encounter for antineoplastic radiation therapy: Secondary | ICD-10-CM | POA: Diagnosis not present

## 2017-03-19 DIAGNOSIS — Z833 Family history of diabetes mellitus: Secondary | ICD-10-CM | POA: Diagnosis not present

## 2017-03-19 DIAGNOSIS — Z9889 Other specified postprocedural states: Secondary | ICD-10-CM | POA: Diagnosis not present

## 2017-03-19 DIAGNOSIS — Z803 Family history of malignant neoplasm of breast: Secondary | ICD-10-CM | POA: Diagnosis not present

## 2017-03-19 DIAGNOSIS — Z800 Family history of malignant neoplasm of digestive organs: Secondary | ICD-10-CM | POA: Diagnosis not present

## 2017-03-19 DIAGNOSIS — N400 Enlarged prostate without lower urinary tract symptoms: Secondary | ICD-10-CM | POA: Diagnosis not present

## 2017-03-22 ENCOUNTER — Ambulatory Visit: Payer: Non-veteran care

## 2017-03-23 ENCOUNTER — Ambulatory Visit
Admission: RE | Admit: 2017-03-23 | Discharge: 2017-03-23 | Disposition: A | Discharge status: Home / Self Care | Payer: Non-veteran care | Source: Ambulatory Visit | Attending: Radiation Oncology | Admitting: Radiation Oncology

## 2017-03-23 DIAGNOSIS — C61 Malignant neoplasm of prostate: Secondary | ICD-10-CM | POA: Diagnosis not present

## 2017-03-23 DIAGNOSIS — Z510 Encounter for antineoplastic radiation therapy: Secondary | ICD-10-CM | POA: Diagnosis not present

## 2017-03-23 NOTE — Progress Notes (Signed)
Patient presented to the clinic following radiation therapy requesting blood pressure check. Patient's bp was lower than normal on Friday, December 7 and was told by Dr. Tammi Klippel to hold bp medications. Patient reports he hasn't taken his bp medication since Friday and provides a list of blood pressures within normal limits since then. BP today normal. Patient questioned if he should resume taking bp meds. Instructed patient to follow up with PCP for directions. Patient verbalized understanding.

## 2017-03-24 ENCOUNTER — Ambulatory Visit
Admission: RE | Admit: 2017-03-24 | Discharge: 2017-03-24 | Disposition: A | Discharge status: Home / Self Care | Payer: Non-veteran care | Source: Ambulatory Visit | Attending: Radiation Oncology | Admitting: Radiation Oncology

## 2017-03-24 DIAGNOSIS — Z510 Encounter for antineoplastic radiation therapy: Secondary | ICD-10-CM | POA: Diagnosis not present

## 2017-03-24 DIAGNOSIS — C61 Malignant neoplasm of prostate: Secondary | ICD-10-CM | POA: Diagnosis not present

## 2017-03-25 ENCOUNTER — Ambulatory Visit
Admission: RE | Admit: 2017-03-25 | Discharge: 2017-03-25 | Disposition: A | Discharge status: Home / Self Care | Payer: Non-veteran care | Source: Ambulatory Visit | Attending: Radiation Oncology | Admitting: Radiation Oncology

## 2017-03-25 DIAGNOSIS — C61 Malignant neoplasm of prostate: Secondary | ICD-10-CM | POA: Diagnosis not present

## 2017-03-25 DIAGNOSIS — Z510 Encounter for antineoplastic radiation therapy: Secondary | ICD-10-CM | POA: Diagnosis not present

## 2017-03-26 ENCOUNTER — Ambulatory Visit
Admission: RE | Admit: 2017-03-26 | Discharge: 2017-03-26 | Disposition: A | Discharge status: Home / Self Care | Payer: Non-veteran care | Source: Ambulatory Visit | Attending: Radiation Oncology | Admitting: Radiation Oncology

## 2017-03-26 DIAGNOSIS — C61 Malignant neoplasm of prostate: Secondary | ICD-10-CM | POA: Diagnosis not present

## 2017-03-26 DIAGNOSIS — Z510 Encounter for antineoplastic radiation therapy: Secondary | ICD-10-CM | POA: Diagnosis not present

## 2017-03-27 ENCOUNTER — Ambulatory Visit
Admission: RE | Admit: 2017-03-27 | Discharge: 2017-03-27 | Disposition: A | Discharge status: Home / Self Care | Payer: Non-veteran care | Source: Ambulatory Visit | Attending: Radiation Oncology | Admitting: Radiation Oncology

## 2017-03-27 DIAGNOSIS — Z510 Encounter for antineoplastic radiation therapy: Secondary | ICD-10-CM | POA: Diagnosis not present

## 2017-03-27 DIAGNOSIS — C61 Malignant neoplasm of prostate: Secondary | ICD-10-CM | POA: Diagnosis not present

## 2017-03-29 ENCOUNTER — Ambulatory Visit
Admission: RE | Admit: 2017-03-29 | Discharge: 2017-03-29 | Disposition: A | Discharge status: Home / Self Care | Payer: Non-veteran care | Source: Ambulatory Visit | Attending: Radiation Oncology | Admitting: Radiation Oncology

## 2017-03-29 DIAGNOSIS — C61 Malignant neoplasm of prostate: Secondary | ICD-10-CM | POA: Diagnosis not present

## 2017-03-29 DIAGNOSIS — Z510 Encounter for antineoplastic radiation therapy: Secondary | ICD-10-CM | POA: Diagnosis not present

## 2017-03-30 ENCOUNTER — Ambulatory Visit
Admission: RE | Admit: 2017-03-30 | Discharge: 2017-03-30 | Disposition: A | Discharge status: Home / Self Care | Payer: Non-veteran care | Source: Ambulatory Visit | Attending: Radiation Oncology | Admitting: Radiation Oncology

## 2017-03-30 DIAGNOSIS — C61 Malignant neoplasm of prostate: Secondary | ICD-10-CM | POA: Diagnosis not present

## 2017-03-30 DIAGNOSIS — Z510 Encounter for antineoplastic radiation therapy: Secondary | ICD-10-CM | POA: Diagnosis not present

## 2017-03-31 ENCOUNTER — Ambulatory Visit: Payer: Non-veteran care

## 2017-03-31 ENCOUNTER — Ambulatory Visit
Admission: RE | Admit: 2017-03-31 | Discharge: 2017-03-31 | Disposition: A | Discharge status: Home / Self Care | Payer: Non-veteran care | Source: Ambulatory Visit | Attending: Radiation Oncology | Admitting: Radiation Oncology

## 2017-03-31 DIAGNOSIS — Z510 Encounter for antineoplastic radiation therapy: Secondary | ICD-10-CM | POA: Diagnosis not present

## 2017-03-31 DIAGNOSIS — C61 Malignant neoplasm of prostate: Secondary | ICD-10-CM | POA: Diagnosis not present

## 2017-04-01 ENCOUNTER — Ambulatory Visit: Payer: Non-veteran care

## 2017-04-01 ENCOUNTER — Ambulatory Visit
Admission: RE | Admit: 2017-04-01 | Discharge: 2017-04-01 | Disposition: A | Discharge status: Home / Self Care | Payer: Non-veteran care | Source: Ambulatory Visit | Attending: Radiation Oncology | Admitting: Radiation Oncology

## 2017-04-01 DIAGNOSIS — Z510 Encounter for antineoplastic radiation therapy: Secondary | ICD-10-CM | POA: Diagnosis not present

## 2017-04-01 DIAGNOSIS — C61 Malignant neoplasm of prostate: Secondary | ICD-10-CM | POA: Diagnosis not present

## 2017-04-02 ENCOUNTER — Ambulatory Visit
Admission: RE | Admit: 2017-04-02 | Discharge: 2017-04-02 | Disposition: A | Discharge status: Home / Self Care | Payer: Non-veteran care | Source: Ambulatory Visit | Attending: Radiation Oncology | Admitting: Radiation Oncology

## 2017-04-02 DIAGNOSIS — C61 Malignant neoplasm of prostate: Secondary | ICD-10-CM | POA: Diagnosis not present

## 2017-04-02 DIAGNOSIS — Z510 Encounter for antineoplastic radiation therapy: Secondary | ICD-10-CM | POA: Diagnosis not present

## 2017-04-05 ENCOUNTER — Ambulatory Visit
Admission: RE | Admit: 2017-04-05 | Discharge: 2017-04-05 | Disposition: A | Discharge status: Home / Self Care | Payer: Non-veteran care | Source: Ambulatory Visit | Attending: Radiation Oncology | Admitting: Radiation Oncology

## 2017-04-05 DIAGNOSIS — Z510 Encounter for antineoplastic radiation therapy: Secondary | ICD-10-CM | POA: Diagnosis not present

## 2017-04-05 DIAGNOSIS — C61 Malignant neoplasm of prostate: Secondary | ICD-10-CM | POA: Diagnosis not present

## 2017-04-07 ENCOUNTER — Ambulatory Visit
Admission: RE | Admit: 2017-04-07 | Discharge: 2017-04-07 | Disposition: A | Discharge status: Home / Self Care | Payer: Non-veteran care | Source: Ambulatory Visit | Attending: Radiation Oncology | Admitting: Radiation Oncology

## 2017-04-07 DIAGNOSIS — Z510 Encounter for antineoplastic radiation therapy: Secondary | ICD-10-CM | POA: Diagnosis not present

## 2017-04-07 DIAGNOSIS — C61 Malignant neoplasm of prostate: Secondary | ICD-10-CM | POA: Diagnosis not present

## 2017-04-08 ENCOUNTER — Ambulatory Visit
Admission: RE | Admit: 2017-04-08 | Discharge: 2017-04-08 | Disposition: A | Discharge status: Home / Self Care | Payer: Non-veteran care | Source: Ambulatory Visit | Attending: Radiation Oncology | Admitting: Radiation Oncology

## 2017-04-08 DIAGNOSIS — Z510 Encounter for antineoplastic radiation therapy: Secondary | ICD-10-CM | POA: Diagnosis not present

## 2017-04-08 DIAGNOSIS — C61 Malignant neoplasm of prostate: Secondary | ICD-10-CM | POA: Diagnosis not present

## 2017-04-09 ENCOUNTER — Ambulatory Visit
Admission: RE | Admit: 2017-04-09 | Discharge: 2017-04-09 | Disposition: A | Discharge status: Home / Self Care | Payer: Non-veteran care | Source: Ambulatory Visit | Attending: Radiation Oncology | Admitting: Radiation Oncology

## 2017-04-09 DIAGNOSIS — C61 Malignant neoplasm of prostate: Secondary | ICD-10-CM | POA: Diagnosis not present

## 2017-04-09 DIAGNOSIS — Z510 Encounter for antineoplastic radiation therapy: Secondary | ICD-10-CM | POA: Diagnosis not present

## 2017-04-12 ENCOUNTER — Ambulatory Visit
Admission: RE | Admit: 2017-04-12 | Discharge: 2017-04-12 | Disposition: A | Discharge status: Home / Self Care | Payer: Non-veteran care | Source: Ambulatory Visit | Attending: Radiation Oncology | Admitting: Radiation Oncology

## 2017-04-12 DIAGNOSIS — Z510 Encounter for antineoplastic radiation therapy: Secondary | ICD-10-CM | POA: Diagnosis not present

## 2017-04-12 DIAGNOSIS — C61 Malignant neoplasm of prostate: Secondary | ICD-10-CM | POA: Diagnosis not present

## 2017-04-13 DIAGNOSIS — Z510 Encounter for antineoplastic radiation therapy: Secondary | ICD-10-CM | POA: Diagnosis present

## 2017-04-13 DIAGNOSIS — C61 Malignant neoplasm of prostate: Secondary | ICD-10-CM | POA: Diagnosis not present

## 2017-04-14 ENCOUNTER — Ambulatory Visit
Admission: RE | Admit: 2017-04-14 | Discharge: 2017-04-14 | Disposition: A | Discharge status: Home / Self Care | Payer: Non-veteran care | Source: Ambulatory Visit | Attending: Radiation Oncology | Admitting: Radiation Oncology

## 2017-04-14 DIAGNOSIS — C61 Malignant neoplasm of prostate: Secondary | ICD-10-CM | POA: Diagnosis not present

## 2017-04-14 DIAGNOSIS — Z510 Encounter for antineoplastic radiation therapy: Secondary | ICD-10-CM | POA: Diagnosis not present

## 2017-04-15 ENCOUNTER — Ambulatory Visit
Admission: RE | Admit: 2017-04-15 | Discharge: 2017-04-15 | Disposition: A | Discharge status: Home / Self Care | Payer: Non-veteran care | Source: Ambulatory Visit | Attending: Radiation Oncology | Admitting: Radiation Oncology

## 2017-04-15 DIAGNOSIS — C61 Malignant neoplasm of prostate: Secondary | ICD-10-CM | POA: Diagnosis not present

## 2017-04-15 DIAGNOSIS — Z510 Encounter for antineoplastic radiation therapy: Secondary | ICD-10-CM | POA: Diagnosis not present

## 2017-04-16 ENCOUNTER — Ambulatory Visit
Admission: RE | Admit: 2017-04-16 | Discharge: 2017-04-16 | Disposition: A | Discharge status: Home / Self Care | Payer: Non-veteran care | Source: Ambulatory Visit | Attending: Radiation Oncology | Admitting: Radiation Oncology

## 2017-04-16 DIAGNOSIS — C61 Malignant neoplasm of prostate: Secondary | ICD-10-CM | POA: Diagnosis not present

## 2017-04-16 DIAGNOSIS — Z510 Encounter for antineoplastic radiation therapy: Secondary | ICD-10-CM | POA: Diagnosis not present

## 2017-04-19 ENCOUNTER — Ambulatory Visit
Admission: RE | Admit: 2017-04-19 | Discharge: 2017-04-19 | Disposition: A | Discharge status: Home / Self Care | Payer: Non-veteran care | Source: Ambulatory Visit | Attending: Radiation Oncology | Admitting: Radiation Oncology

## 2017-04-19 DIAGNOSIS — Z510 Encounter for antineoplastic radiation therapy: Secondary | ICD-10-CM | POA: Diagnosis not present

## 2017-04-19 DIAGNOSIS — I959 Hypotension, unspecified: Secondary | ICD-10-CM | POA: Diagnosis not present

## 2017-04-19 DIAGNOSIS — C61 Malignant neoplasm of prostate: Secondary | ICD-10-CM | POA: Diagnosis not present

## 2017-04-19 DIAGNOSIS — Z23 Encounter for immunization: Secondary | ICD-10-CM | POA: Diagnosis not present

## 2017-04-19 DIAGNOSIS — R7301 Impaired fasting glucose: Secondary | ICD-10-CM | POA: Diagnosis not present

## 2017-04-19 DIAGNOSIS — E785 Hyperlipidemia, unspecified: Secondary | ICD-10-CM | POA: Diagnosis not present

## 2017-04-20 ENCOUNTER — Ambulatory Visit
Admission: RE | Admit: 2017-04-20 | Discharge: 2017-04-20 | Disposition: A | Discharge status: Home / Self Care | Payer: Non-veteran care | Source: Ambulatory Visit | Attending: Radiation Oncology | Admitting: Radiation Oncology

## 2017-04-20 DIAGNOSIS — C61 Malignant neoplasm of prostate: Secondary | ICD-10-CM | POA: Diagnosis not present

## 2017-04-20 DIAGNOSIS — Z510 Encounter for antineoplastic radiation therapy: Secondary | ICD-10-CM | POA: Diagnosis not present

## 2017-04-21 ENCOUNTER — Ambulatory Visit
Admission: RE | Admit: 2017-04-21 | Discharge: 2017-04-21 | Disposition: A | Discharge status: Home / Self Care | Payer: Non-veteran care | Source: Ambulatory Visit | Attending: Radiation Oncology | Admitting: Radiation Oncology

## 2017-04-21 DIAGNOSIS — C61 Malignant neoplasm of prostate: Secondary | ICD-10-CM | POA: Diagnosis not present

## 2017-04-21 DIAGNOSIS — Z510 Encounter for antineoplastic radiation therapy: Secondary | ICD-10-CM | POA: Diagnosis not present

## 2017-04-22 ENCOUNTER — Encounter: Payer: Self-pay | Admitting: Medical Oncology

## 2017-04-22 ENCOUNTER — Ambulatory Visit: Payer: Non-veteran care

## 2017-04-22 ENCOUNTER — Encounter: Payer: Self-pay | Admitting: Radiation Oncology

## 2017-04-22 ENCOUNTER — Ambulatory Visit
Admission: RE | Admit: 2017-04-22 | Discharge: 2017-04-22 | Disposition: A | Discharge status: Home / Self Care | Payer: Non-veteran care | Source: Ambulatory Visit | Attending: Radiation Oncology | Admitting: Radiation Oncology

## 2017-04-22 DIAGNOSIS — C61 Malignant neoplasm of prostate: Secondary | ICD-10-CM | POA: Diagnosis not present

## 2017-04-22 DIAGNOSIS — Z510 Encounter for antineoplastic radiation therapy: Secondary | ICD-10-CM | POA: Diagnosis not present

## 2017-04-23 ENCOUNTER — Ambulatory Visit: Payer: Non-veteran care

## 2017-04-23 NOTE — Progress Notes (Signed)
  Radiation Oncology         [phone] ________________________________  Name: Gregory Ewing MRN: [id number]  Date: 04/22/2017  DOB: Mar 09, 1946  End of Treatment Note  Diagnosis:   72 y.o. male with Stage T2cN1M0 adenocarcinoma of the prostate with Gleason Score of 4+5, and PSA of 72.80    Indication for treatment:  Curative, Definitive Radiotherapy       Radiation treatment dates:   02/23/2017 - 04/22/2017  Site/dose:  1. The prostate, seminal vesicles, and pelvic lymph nodes were initially treated to 45 Gy in 25 fractions of 1.8 Gy  2. The prostate only was boosted to 75 Gy with 15 additional fractions of 2.0 Gy   Beams/energy:  1. The prostate, seminal vesicles, and pelvic lymph nodes were initially treated using VMAT intensity modulated radiotherapy delivering 6 megavolt photons. Image guidance was performed with CB-CT studies prior to each fraction. He was immobilized with a body fix lower extremity mold.  2. The prostate only was boosted using VMAT intensity modulated radiotherapy delivering 6 megavolt photons. Image guidance was performed with CB-CT studies prior to each fraction. He was immobilized with a body fix lower extremity mold.  Narrative: The patient tolerated radiation treatment relatively well.   The patient experienced modest fatigue and some minor urinary irritation symptoms of nocturia x4-6, urgency, leakage, and incomplete emptying. He continued on Flomax for his urinary symptoms which helped. He reported severe dysuria, only at night, managed with AZO. He denied any hematuria. He also reported diarrhea 2-3 times per day, relieved by Imodium.  Plan: The patient has completed radiation treatment. He will return to radiation oncology clinic for routine followup in one month. I advised him to call or return sooner if he has any questions or concerns related to his recovery or treatment. ________________________________  Sheral Apley. Tammi Klippel, M.D.  This document  serves as a record of services personally performed by Tyler Pita, MD. It was created on his behalf by Rae Lips, a trained medical scribe. The creation of this record is based on the scribe's personal observations and the provider's statements to them. This document has been checked and approved by the attending provider.

## 2017-05-25 ENCOUNTER — Ambulatory Visit: Payer: Self-pay | Admitting: Urology

## 2017-06-01 NOTE — Progress Notes (Signed)
Radiation Oncology         [phone] ________________________________  Name: Gregory Ewing MRN: [id number]  Date: 06/02/2017  DOB: Jul 03, 1945  Post Treatment Note  CC: Leonard Downing, MD  Claire Shown, MD  Diagnosis:   72 y.o. male with Stage T2cN1M0 adenocarcinoma of the prostate with Gleason Score of 4+5, and PSA of 72.80   Interval Since Last Radiation:  6 weeks  02/23/2017 - 04/22/2017: 1. The prostate, seminal vesicles, and pelvic lymph nodes were initially treated to 45 Gy in 25 fractions of 1.8 Gy  2. The prostate only was boosted to 75 Gy with 15 additional fractions of 2.0 Gy   Narrative:  The patient returns today for routine follow-up.  He tolerated radiation treatment relatively well.   Heexperienced modest fatigue and some minor urinary irritation symptoms of nocturia x4-6, urgency, leakage, and incomplete emptying. He continued on Flomax for his urinary symptoms which helped. He reported severe dysuria, only at night, managed with AZO. He denied any hematuria. He also reported diarrhea 2-3 times per day, relieved by Imodium.                             On review of systems, the patient states that he is doing very well overall.  He continues with increased urinary frequency, urgency and nocturia but denies weak stream, straining or incontinence.  His current IPSS score is 16 indicating moderate urinary symptoms.  The he also continues with bowel urgency and frequency with loose stools 2-3 times per day despite using Imodium.  He denies dysuria, gross hematuria, hematochezia, fever, chills or night sweats.  He reports a healthy appetite and has actually put on a few pounds.  ALLERGIES:  has No Known Allergies.  Meds: Current Outpatient Medications  Medication Sig Dispense Refill  . aspirin 81 MG tablet Take 81 mg by mouth daily.    . Calcium Carb-Cholecalciferol (CALCIUM/VITAMIN D) 600-400 MG-UNIT TABS Take by mouth.    . cetirizine (ZYRTEC) 10 MG tablet Take  10 mg by mouth daily.    . citalopram (CELEXA) 40 MG tablet Take 40 mg by mouth daily. 1/2 tab daily    . lisinopril-hydrochlorothiazide (PRINZIDE,ZESTORETIC) 20-25 MG tablet Take 1 tablet by mouth daily.    Marland Kitchen lovastatin (MEVACOR) 20 MG tablet Take 20 mg by mouth at bedtime.    . NON FORMULARY B12 injection once a month    . NON FORMULARY Immune support OTC vitamin c 1000 mg daily    . tamsulosin (FLOMAX) 0.4 MG CAPS capsule Take 0.8 mg by mouth.      No current facility-administered medications for this encounter.     Physical Findings:  height is 5\' 2"  (1.575 m) and weight is 175 lb 3.2 oz (79.5 kg). His oral temperature is 98.5 F (36.9 C). His blood pressure is 138/60 and his pulse is 88. His respiration is 20 and oxygen saturation is 99%.  Pain Assessment Pain Score: 0-No pain/10 In general this is a well appearing Caucasian male in no acute distress.  He's alert and oriented x4 and appropriate throughout the examination. Cardiopulmonary assessment is negative for acute distress and he exhibits normal effort.   Lab Findings: Lab Results  Component Value Date   WBC 8.8 11/19/2011   HGB 12.8 (L) 11/19/2011   HCT 38.6 11/19/2011   MCV 88.6 11/19/2011   PLT 322 11/19/2011     Radiographic Findings: No results found.  Impression/Plan: 1. 72 y.o. male with Stage T2cN1M0 adenocarcinoma of the prostate with Gleason Score of 4+5, and PSA of 72.80.   He will continue to follow up with urology for ongoing PSA determinations and has an appointment scheduled with Dr. Butch Penny at the [facility] in March 2019. He is tolerating his ADT relatively well and anticipates completing a 2-year course of therapy.  He is due for his next injection in March.  He understands what to expect with regards to PSA monitoring going forward. I will look forward to following his response to treatment via correspondence with urology, and would be happy to continue to participate in his care if clinically indicated. I  talked to the patient about what to expect in the future, including his risk for erectile dysfunction and rectal bleeding. I encouraged him to call or return to the office if he has any questions regarding his previous radiation or possible radiation side effects. He was comfortable with this plan and will follow up as needed.    Nicholos Johns, PA-C

## 2017-06-02 ENCOUNTER — Other Ambulatory Visit: Payer: Self-pay

## 2017-06-02 ENCOUNTER — Other Ambulatory Visit: Payer: Self-pay | Admitting: Urology

## 2017-06-02 ENCOUNTER — Encounter: Payer: Self-pay | Admitting: Urology

## 2017-06-02 ENCOUNTER — Ambulatory Visit
Admission: RE | Admit: 2017-06-02 | Discharge: 2017-06-02 | Disposition: A | Discharge status: Home / Self Care | Payer: Medicare HMO | Source: Ambulatory Visit | Attending: Urology | Admitting: Urology

## 2017-06-02 VITALS — BP 138/60 | HR 88 | Temp 98.5°F | Resp 20 | Ht 62.0 in | Wt 175.2 lb

## 2017-06-02 DIAGNOSIS — Z923 Personal history of irradiation: Secondary | ICD-10-CM | POA: Diagnosis not present

## 2017-06-02 DIAGNOSIS — R351 Nocturia: Secondary | ICD-10-CM | POA: Insufficient documentation

## 2017-06-02 DIAGNOSIS — Z7982 Long term (current) use of aspirin: Secondary | ICD-10-CM | POA: Insufficient documentation

## 2017-06-02 DIAGNOSIS — Z79899 Other long term (current) drug therapy: Secondary | ICD-10-CM | POA: Diagnosis not present

## 2017-06-02 DIAGNOSIS — C61 Malignant neoplasm of prostate: Secondary | ICD-10-CM | POA: Insufficient documentation

## 2017-06-02 DIAGNOSIS — R197 Diarrhea, unspecified: Secondary | ICD-10-CM | POA: Insufficient documentation

## 2017-06-02 MED ORDER — DIPHENOXYLATE-ATROPINE 2.5-0.025 MG PO TABS: 1.0000 | ORAL_TABLET | Freq: Four times a day (QID) | ORAL | 0 refills | Status: DC | PRN

## 2017-06-02 NOTE — Addendum Note (Signed)
Encounter addended by: Malena Edman, RN on: 06/02/2017 3:43 PM  Actions taken: Charge Capture section accepted

## 2017-06-16 DIAGNOSIS — J209 Acute bronchitis, unspecified: Secondary | ICD-10-CM | POA: Diagnosis not present

## 2017-07-15 ENCOUNTER — Encounter: Payer: Self-pay | Admitting: Genetic Counselor

## 2017-08-23 ENCOUNTER — Encounter: Payer: Self-pay | Admitting: Genetic Counselor

## 2017-08-23 ENCOUNTER — Inpatient Hospital Stay: Payer: Non-veteran care | Attending: Genetic Counselor | Admitting: Genetic Counselor

## 2017-08-23 ENCOUNTER — Inpatient Hospital Stay: Payer: Non-veteran care

## 2017-08-23 DIAGNOSIS — Z7183 Encounter for nonprocreative genetic counseling: Secondary | ICD-10-CM | POA: Diagnosis not present

## 2017-08-23 DIAGNOSIS — C61 Malignant neoplasm of prostate: Secondary | ICD-10-CM | POA: Diagnosis not present

## 2017-08-23 DIAGNOSIS — Z803 Family history of malignant neoplasm of breast: Secondary | ICD-10-CM

## 2017-08-23 NOTE — Progress Notes (Signed)
REFERRING PROVIDER: Leonard Downing, MD [address] [place], [place] [address]  PRIMARY PROVIDER:  Leonard Downing, MD  PRIMARY REASON FOR VISIT:  1. Malignant neoplasm of prostate ([place])   2. Family history of breast cancer      HISTORY OF PRESENT ILLNESS:   Gregory Ewing, a 72 y.o. male, was seen for a [place] cancer genetics consultation at the request of Dr. Arelia Sneddon due to a personal and family history of cancer.  Gregory Ewing presents to clinic today to discuss the possibility of a hereditary predisposition to cancer, genetic testing, and to further clarify his future cancer risks, as well as potential cancer risks for family members.   In June 2018, at the age of 16, Gregory Ewing was diagnosed with Prostate cancer. This was treated with radiation.  By report, the Gleason score is 9.     CANCER HISTORY:   No history exists.       Past Medical History:  Diagnosis Date  . Astigmatism   . Family history of breast cancer   . Hyperlipidemia   . Hypertension   . Leukocytosis   . LFTs abnormal   . Presbyopia   . Prostate cancer [facility])     Past Surgical History:  Procedure Laterality Date  . APPENDECTOMY    . BACK SURGERY    . partial small bowel resection    . TONSILLECTOMY      Social History   Socioeconomic History  . Marital status: Single    Spouse name: Not on file  . Number of children: Not on file  . Years of education: Not on file  . Highest education level: Not on file  Occupational History  . Not on file  Social Needs  . Financial resource strain: Not on file  . Food insecurity:    Worry: Not on file    Inability: Not on file  . Transportation needs:    Medical: Not on file    Non-medical: Not on file  Tobacco Use  . Smoking status: Never Smoker  . Smokeless tobacco: Never Used  Substance and Sexual Activity  . Alcohol use: No  . Drug use: No  . Sexual activity: Not Currently  Lifestyle  . Physical activity:    Days per  week: Not on file    Minutes per session: Not on file  . Stress: Not on file  Relationships  . Social connections:    Talks on phone: Not on file    Gets together: Not on file    Attends religious service: Not on file    Active member of club or organization: Not on file    Attends meetings of clubs or organizations: Not on file    Relationship status: Not on file  Other Topics Concern  . Not on file  Social History Narrative  . Not on file     FAMILY HISTORY:  We obtained a detailed, 4-generation family history.  Significant diagnoses are listed below: Family History  Problem Relation Age of Onset  . Cancer Mother 89       breast  . Diabetes Sister   . Cancer Brother        throat/hx of smoking and drinking  . Diabetes Daughter        d. 15  . Heart attack Paternal Uncle   . Heart attack Maternal Grandmother   . Heart attack Maternal Grandfather   . Heart attack Paternal Grandmother   . Heart attack  Paternal Grandfather   . Throat cancer Brother   . Cervical cancer Daughter     The patient has two daughters and a son.  One daughter died from complications of diabetes, and his second daughter had cervical cancer at age 33.  He has three brothers.  One brother died of throat cancer at 74.  Both parents are deceased.  The patient's mother had breast cancer at 29.  She had three sisters and four brothers who were cancer free.  Both maternal grandparents are deceased.  The patient's father died of a car accident at 70.  He had three sisters and a brother who were cancer free.  The paternal grandparents died of heart attacks.  Gregory Ewing is unaware of previous family history of genetic testing for hereditary cancer risks. Patient's ancestors are of [place], [place] and [place] descent. There is no reported Ashkenazi Jewish ancestry. There is no known consanguinity.  GENETIC COUNSELING ASSESSMENT: Gregory Ewing is a 72 y.o. male with a personal and family history of cancer which  is somewhat suggestive of a hereditary cancer syndrome and predisposition to cancer. We, therefore, discussed and recommended the following at today's visit.   DISCUSSION: We disucssed that about 5-10% of prostate cancer is hereditary with most cases due to BRCA mutations.  We discussed that there are other genes that can increase the risk for prostate cancer, and some of those also increase the risk for breast cancer or other cancers.  We reviewed the characteristics, features and inheritance patterns of hereditary cancer syndromes. We also discussed genetic testing, including the appropriate family members to test, the process of testing, insurance coverage and turn-around-time for results. We discussed the implications of a negative, positive and/or variant of uncertain significant result. We recommended Gregory Ewing pursue genetic testing for the common hereditary gene panel. The Hereditary Gene Panel offered by [facility] includes sequencing and/or deletion duplication testing of the following 47 genes: APC, ATM, AXIN2, BARD1, BMPR1A, BRCA1, BRCA2, BRIP1, CDH1, CDK4, CDKN2A (p14ARF), CDKN2A (p16INK4a), CHEK2, CTNNA1, DICER1, EPCAM (Deletion/duplication testing only), GREM1 (promoter region deletion/duplication testing only), KIT, MEN1, MLH1, MSH2, MSH3, MSH6, MUTYH, NBN, NF1, NHTL1, PALB2, PDGFRA, PMS2, POLD1, POLE, PTEN, RAD50, RAD51C, RAD51D, SDHB, SDHC, SDHD, SMAD4, SMARCA4. STK11, TP53, TSC1, TSC2, and VHL.  The following genes were evaluated for sequence changes only: SDHA and HOXB13 c.251G>A variant only.   Based on Gregory Ewing personal and family history of cancer, he meets medical criteria for genetic testing. Despite that he meets criteria, he may still have an out of pocket cost. We discussed that if his out of pocket cost for testing is over $100, the laboratory will call and confirm whether he wants to proceed with testing.  If the out of pocket cost of testing is less than $100 he will be billed  by the genetic testing laboratory.   PLAN: After considering the risks, benefits, and limitations, Gregory Ewing  provided informed consent to pursue genetic testing and the blood sample was sent to [facility] for analysis of the common hereditary cancer panel. Results should be available within approximately 2-3 weeks' time, at which point they will be disclosed by telephone to Gregory Ewing, as will any additional recommendations warranted by these results. Gregory Ewing will receive a summary of his genetic counseling visit and a copy of his results once available. This information will also be available in Epic. We encouraged Gregory Ewing to remain in contact with cancer genetics annually so that we can continuously update the  family history and inform him of any changes in cancer genetics and testing that may be of benefit for his family. Gregory Ewing questions were answered to his satisfaction today. Our contact information was provided should additional questions or concerns arise.  Lastly, we encouraged Gregory Ewing to remain in contact with cancer genetics annually so that we can continuously update the family history and inform him of any changes in cancer genetics and testing that may be of benefit for this family.   Mr.  Gregory Ewing questions were answered to his satisfaction today. Our contact information was provided should additional questions or concerns arise. Thank you for the referral and allowing [place] to share in the care of your patient.   Karen P. Florene Glen, [place], [facility] Certified Genetic Counselor [email or web address] phone: [phone]  The patient was seen for a total of 50 minutes in face-to-face genetic counseling.  This patient was discussed with Drs. Magrinat, Lindi Adie and/or Burr Medico who agrees with the above.    _______________________________________________________________________ For Office Staff:  Number of people involved in session: 2 Was an Intern/ student involved  with case: no

## 2017-08-26 ENCOUNTER — Ambulatory Visit
Admission: RE | Admit: 2017-08-26 | Discharge: 2017-08-26 | Disposition: A | Discharge status: Home / Self Care | Payer: Non-veteran care | Source: Ambulatory Visit | Attending: Family Medicine | Admitting: Family Medicine

## 2017-08-26 ENCOUNTER — Other Ambulatory Visit: Payer: Self-pay | Admitting: Family Medicine

## 2017-08-26 DIAGNOSIS — R062 Wheezing: Secondary | ICD-10-CM

## 2017-08-26 DIAGNOSIS — R05 Cough: Secondary | ICD-10-CM

## 2017-08-26 DIAGNOSIS — J209 Acute bronchitis, unspecified: Secondary | ICD-10-CM | POA: Diagnosis not present

## 2017-08-26 DIAGNOSIS — R058 Other specified cough: Secondary | ICD-10-CM

## 2017-08-31 ENCOUNTER — Telehealth: Payer: Self-pay | Admitting: Genetic Counselor

## 2017-08-31 ENCOUNTER — Encounter: Payer: Self-pay | Admitting: Genetic Counselor

## 2017-08-31 ENCOUNTER — Ambulatory Visit: Payer: Self-pay | Admitting: Genetic Counselor

## 2017-08-31 DIAGNOSIS — Z803 Family history of malignant neoplasm of breast: Secondary | ICD-10-CM

## 2017-08-31 DIAGNOSIS — Z1379 Encounter for other screening for genetic and chromosomal anomalies: Secondary | ICD-10-CM | POA: Insufficient documentation

## 2017-08-31 DIAGNOSIS — C61 Malignant neoplasm of prostate: Secondary | ICD-10-CM

## 2017-08-31 NOTE — Progress Notes (Signed)
HPI:  [name] was previously seen in the [facility] clinic due to a personal and family history of cancer and concerns regarding a hereditary predisposition to cancer. Please refer to our prior cancer genetics clinic note for more information regarding [name] medical, social and family histories, and our assessment and recommendations, at the time. [name] recent genetic test results were disclosed to him, as were recommendations warranted by these results. These results and recommendations are discussed in more detail below.  CANCER HISTORY:    Malignant neoplasm of prostate ([place])   12/22/2016 Initial Diagnosis    Malignant neoplasm of prostate ([place])      08/30/2017 Genetic Testing    CDKN2A (p16INK4a) c.373G>C (p.Asp125His) and PALB2 c.2356C>T (p.His786Tyr) VUSs found in the hereditary cancer panel.  The Hereditary Gene Panel offered by [facility] includes sequencing and/or deletion duplication testing of the following 47 genes: APC, ATM, AXIN2, BARD1, BMPR1A, BRCA1, BRCA2, BRIP1, CDH1, CDK4, CDKN2A (p14ARF), CDKN2A (p16INK4a), CHEK2, CTNNA1, DICER1, EPCAM (Deletion/duplication testing only), GREM1 (promoter region deletion/duplication testing only), KIT, MEN1, MLH1, MSH2, MSH3, MSH6, MUTYH, NBN, NF1, NHTL1, PALB2, PDGFRA, PMS2, POLD1, POLE, PTEN, RAD50, RAD51C, RAD51D, SDHB, SDHC, SDHD, SMAD4, SMARCA4. STK11, TP53, TSC1, TSC2, and VHL.  The following genes were evaluated for sequence changes only: SDHA and HOXB13 c.251G>A variant only. The report date is Aug 30, 2017.       FAMILY HISTORY:  We obtained a detailed, 4-generation family history.  Significant diagnoses are listed below: Family History  Problem Relation Age of Onset  . Cancer Mother 61       breast  . Diabetes Sister   . Cancer Brother        throat/hx of smoking and drinking  . Diabetes Daughter        d. 69  . Heart attack Paternal Uncle   . Heart attack Maternal Grandmother   . Heart  attack Maternal Grandfather   . Heart attack Paternal Grandmother   . Heart attack Paternal Grandfather   . Throat cancer Brother   . Cervical cancer Daughter     The patient has two daughters and a son.  One daughter died from complications of diabetes, and his second daughter had cervical cancer at age 30.  He has three brothers.  One brother died of throat cancer at 101.  Both parents are deceased.  The patient's mother had breast cancer at 21.  She had three sisters and four brothers who were cancer free.  Both maternal grandparents are deceased.  The patient's father died of a car accident at 51.  He had three sisters and a brother who were cancer free.  The paternal grandparents died of heart attacks.  [name] is unaware of previous family history of genetic testing for hereditary cancer risks. Patient's ancestors are of [place], [place] and [place] descent. There is no reported Ashkenazi Jewish ancestry. There is no known consanguinity.  GENETIC TEST RESULTS: Genetic testing reported out on Aug 30, 2017 through the common hereditary cancer panel found no deleterious mutations.  The Hereditary Gene Panel offered by [facility] includes sequencing and/or deletion duplication testing of the following 47 genes: APC, ATM, AXIN2, BARD1, BMPR1A, BRCA1, BRCA2, BRIP1, CDH1, CDK4, CDKN2A (p14ARF), CDKN2A (p16INK4a), CHEK2, CTNNA1, DICER1, EPCAM (Deletion/duplication testing only), GREM1 (promoter region deletion/duplication testing only), KIT, MEN1, MLH1, MSH2, MSH3, MSH6, MUTYH, NBN, NF1, NHTL1, PALB2, PDGFRA, PMS2, POLD1, POLE, PTEN, RAD50, RAD51C, RAD51D, SDHB, SDHC, SDHD, SMAD4, SMARCA4. STK11, TP53, TSC1,  TSC2, and VHL.  The following genes were evaluated for sequence changes only: SDHA and HOXB13 c.251G>A variant only.  The test report has been scanned into EPIC and is located under the Molecular Pathology section of the Results Review tab.    We discussed with [name] that since the current  genetic testing is not perfect, it is possible there may be a gene mutation in one of these genes that current testing cannot detect, but that chance is small.  We also discussed, that it is possible that another gene that has not yet been discovered, or that we have not yet tested, is responsible for the cancer diagnoses in the family, and it is, therefore, important to remain in touch with cancer genetics in the future so that we can continue to offer [name] the most up to date genetic testing.   Genetic testing did detect two Variants of Unknown Significance - one in the CDKN2A (p16INK4a) gene called c.373G>C (p.Asp125His) and the other in the PALB2 gene called c.2356C>T (p.His786Tyr).  At this time, it is unknown if these variants are associated with increased cancer risk or if this is a normal finding, but most variants such as these get reclassified to being inconsequential. They should not be used to make medical management decisions. With time, we suspect the lab will determine the significance of these variants, if any. If we do learn more about it, we will try to contact [name] to discuss it further. However, it is important to stay in touch with [place] periodically and keep the address and phone number up to date.      CANCER SCREENING RECOMMENDATIONS: This result is reassuring and indicates that [name] likely does not have an increased risk for a future cancer due to a mutation in one of these genes. This normal test also suggests that [name] cancer was most likely not due to an inherited predisposition associated with one of these genes.  Most cancers happen by chance and this negative test suggests that his cancer falls into this category.  We, therefore, recommended he continue to follow the cancer management and screening guidelines provided by his oncology and primary healthcare provider.   An individual's cancer risk and medical management are not determined by  genetic test results alone. Overall cancer risk assessment incorporates additional factors, including personal medical history, family history, and any available genetic information that may result in a personalized plan for cancer prevention and surveillance.  RECOMMENDATIONS FOR FAMILY MEMBERS:  Women in this family might be at some increased risk of developing cancer, over the general population risk, simply due to the family history of cancer.  We recommended women in this family have a yearly mammogram beginning at age 61, or 45 years younger than the earliest onset of cancer, an annual clinical breast exam, and perform monthly breast self-exams. Women in this family should also have a gynecological exam as recommended by their primary provider. All family members should have a colonoscopy by age 19.  FOLLOW-UP: Lastly, we discussed with [name] that cancer genetics is a rapidly advancing field and it is possible that new genetic tests will be appropriate for him and/or his family members in the future. We encouraged him to remain in contact with cancer genetics on an annual basis so we can update his personal and family histories and let him know of advances in cancer genetics that may benefit this family.   Our  contact number was provided. [name] were answered to his satisfaction, and he knows he is welcome to call us at anytime with additional Ewing or concerns.   Roma Kayser, MS, [facility] Certified Genetic Counselor [email or web address]

## 2017-08-31 NOTE — Telephone Encounter (Signed)
Revealed negative genetic testing.  Discussed that we do not know why he has prostate cancer or why there is cancer in the family. It could be due to a different gene that we are not testing, or maybe our current technology may not be able to pick something up.  It will be important for him to keep in contact with genetics to keep up with whether additional testing may be needed.   Revealed that there are two VUS.  We still consider this a normal result.

## 2017-09-10 ENCOUNTER — Encounter: Payer: Self-pay | Admitting: Internal Medicine

## 2017-09-10 ENCOUNTER — Ambulatory Visit (INDEPENDENT_AMBULATORY_CARE_PROVIDER_SITE_OTHER): Payer: No Typology Code available for payment source | Admitting: Internal Medicine

## 2017-09-10 ENCOUNTER — Other Ambulatory Visit (INDEPENDENT_AMBULATORY_CARE_PROVIDER_SITE_OTHER): Payer: No Typology Code available for payment source

## 2017-09-10 VITALS — BP 142/68 | HR 77 | Ht 62.0 in | Wt 184.6 lb

## 2017-09-10 DIAGNOSIS — E669 Obesity, unspecified: Secondary | ICD-10-CM | POA: Diagnosis not present

## 2017-09-10 DIAGNOSIS — R05 Cough: Secondary | ICD-10-CM

## 2017-09-10 DIAGNOSIS — Z87898 Personal history of other specified conditions: Secondary | ICD-10-CM | POA: Diagnosis not present

## 2017-09-10 DIAGNOSIS — R053 Chronic cough: Secondary | ICD-10-CM

## 2017-09-10 DIAGNOSIS — J387 Other diseases of larynx: Secondary | ICD-10-CM | POA: Diagnosis not present

## 2017-09-10 LAB — CBC WITH DIFFERENTIAL/PLATELET
Basophils Absolute: 0 10*3/uL (ref 0.0–0.1)
Basophils Relative: 0.6 % (ref 0.0–3.0)
EOS PCT: 5.2 % — AB (ref 0.0–5.0)
Eosinophils Absolute: 0.3 10*3/uL (ref 0.0–0.7)
HCT: 38 % — ABNORMAL LOW (ref 39.0–52.0)
Hemoglobin: 12.5 g/dL — ABNORMAL LOW (ref 13.0–17.0)
LYMPHS ABS: 0.9 10*3/uL (ref 0.7–4.0)
Lymphocytes Relative: 14.1 % (ref 12.0–46.0)
MCHC: 32.9 g/dL (ref 30.0–36.0)
MCV: 84.4 fl (ref 78.0–100.0)
MONOS PCT: 11.5 % (ref 3.0–12.0)
Monocytes Absolute: 0.7 10*3/uL (ref 0.1–1.0)
NEUTROS ABS: 4.2 10*3/uL (ref 1.4–7.7)
NEUTROS PCT: 68.6 % (ref 43.0–77.0)
PLATELETS: 281 10*3/uL (ref 150.0–400.0)
RBC: 4.5 Mil/uL (ref 4.22–5.81)
RDW: 15.2 % (ref 11.5–15.5)
WBC: 6.2 10*3/uL (ref 4.0–10.5)

## 2017-09-10 LAB — NITRIC OXIDE: Nitric Oxide: 43

## 2017-09-10 MED ORDER — MOMETASONE FUROATE 220 MCG/INH IN AEPB: 2.0000 | INHALATION_SPRAY | Freq: Every day | RESPIRATORY_TRACT | 5 refills | Status: DC

## 2017-09-10 MED ORDER — MOMETASONE FUROATE 220 MCG/INH IN AEPB: 2.0000 | INHALATION_SPRAY | Freq: Every day | RESPIRATORY_TRACT | 0 refills | Status: AC

## 2017-09-10 MED ORDER — PREDNISONE 10 MG PO TABS: ORAL_TABLET | ORAL | 0 refills | Status: DC

## 2017-09-10 NOTE — Patient Instructions (Addendum)
Chronic cough - Plan: Nitric oxide Irritable larynx  - likekly cough variant asthma  - although superimposed cough neuropathy could be going on - do Please take prednisone 40 mg x1 day, then 30 mg x1 day, then 20 mg x1 day, then 10 mg x1 day, and then 5 mg x1 day and stop - start asmanex 2 puff twice daily - inhaled steroids  - use albuterol as needed - check cbc with diff and blood IgE 09/10/2017  = start/ take generic fluticasone inhaler 2 squirts each nostril daily - never take lisinopril   History of snoring Obesity (BMI 30.0-34.9)  - refer our sleep doc  Followup With an APP in 4-6 weeks to report progress; RSI cough score at followup  - if not improving will consider CT chest/sinus abnd trial gabapentin

## 2017-09-10 NOTE — Progress Notes (Signed)
Subjective:     Patient ID: Gregory Ewing, male   DOB: 1946/01/30, 72 y.o.   MRN: [id number]  PCP Leonard Downing, MD  HPI  IOV 09/10/2017  Chief Complaint  Patient presents with  . Consult    Referred by Dr. Arelia Sneddon due to a cough pt has had for years. Pt had xrays performed that showed COPD. Pt does become SOB with activities but the main complaint is the cough with thick white phlegm.     Gregory Ewing 72 y.o. male [place] [address] -has been referred for new consultation for chronic cough by Dr. Arelia Sneddon his primary care doctor.  He is accompanied by his daughter Gregory Ewing.  History is gained from talking to the patient and review of the chart and talking to the daughter.  As best as I can understand he has had a chronic cough with a lot of clearing of the throat for the last 4 to 5 years.  Is been tried on some postnasal drip allergy treatment with tablets without much relief.  He has been on lisinopril ACE inhibitor for several decades.  However recently with prostate cancer treatment he lost a lot of weight and his blood pressure normalized and is not taking his lisinopril for the last 1 month or so.  In the last 4 to 5 weeks his cough is changed and it has become more deep.  Associated with bronchitis episode.  Cough is associated with some wheezing occasionally at night.  The daughter is also heard this.  However he does not have any orthopnea proximal nocturnal dyspnea or or edema.  He does have some dyspnea on exertion.  There is also associated heavy snoring at night with some recent history of taking some afternoon naps.  He has never been tested for sleep apnea.  No history of prior CPAP use.   RSI cough score 24 suggests LPR cough   Dr Lorenza Cambridge Reflux Symptom Index (> 13-15 suggestive of LPR cough) 09/10/2017   Hoarseness of problem with voice 1  Clearing  Of Throat 5  Excess throat mucus or feeling of post nasal drip 5  Difficulty swallowing food,  liquid or tablets 0  Cough after eating or lying down 3  Breathing difficulties or choking episodes 3  Troublesome or annoying cough 5  Sensation of something sticking in throat or lump in throat 2  Heartburn, chest pain, indigestion, or stomach acid coming up 0  TOTAL 24    FeNO 09/10/2017 -> 43 ppb and borderline abnormal   Chest x-ray Aug 26, 2017: Clear lung fields with some mild hyperinflation  Results for Gregory Ewing (MRN [id number]) as of 09/10/2017 11:52  Ref. Range 11/19/2011 10:49  Hemoglobin Latest Ref Range: 13.0 - 17.1 g/dL 12.8 (L)  Results for Gregory Ewing (MRN [id number]) as of 09/10/2017 11:52  Ref. Range 11/19/2011 10:49  Creatinine Latest Ref Range: 0.50 - 1.35 mg/dL 1.29       has a past medical history of Astigmatism, Family history of breast cancer, Hyperlipidemia, Hypertension, Leukocytosis, LFTs abnormal, Presbyopia, and Prostate cancer ([place]).   reports that he has never smoked. He has never used smokeless tobacco.  Past Surgical History:  Procedure Laterality Date  . APPENDECTOMY    . BACK SURGERY    . partial small bowel resection    . TONSILLECTOMY      No Known Allergies  Immunization History  Administered Date(s) Administered  . Influenza, High Dose Seasonal PF  04/19/2017    Family History  Problem Relation Age of Onset  . Cancer Mother 9       breast  . Diabetes Sister   . Cancer Brother        throat/hx of smoking and drinking  . Diabetes Daughter        d. 60  . Heart attack Paternal Uncle   . Heart attack Maternal Grandmother   . Heart attack Maternal Grandfather   . Heart attack Paternal Grandmother   . Heart attack Paternal Grandfather   . Throat cancer Brother   . Cervical cancer Daughter      Current Outpatient Medications:  .  aspirin 81 MG tablet, Take 81 mg by mouth daily., Disp: , Rfl:  .  Calcium Carb-Cholecalciferol (CALCIUM/VITAMIN D) 600-400 MG-UNIT TABS, Take by mouth., Disp: , Rfl:  .  cetirizine  (ZYRTEC) 10 MG tablet, Take 10 mg by mouth daily., Disp: , Rfl:  .  citalopram (CELEXA) 40 MG tablet, Take 40 mg by mouth daily. 1/2 tab daily, Disp: , Rfl:  .  diphenoxylate-atropine (LOMOTIL) 2.5-0.025 MG tablet, Take 1-2 tablets by mouth 4 (four) times daily as needed for diarrhea or loose stools., Disp: 90 tablet, Rfl: 0 .  lovastatin (MEVACOR) 20 MG tablet, Take 20 mg by mouth at bedtime., Disp: , Rfl:  .  NON FORMULARY, B12 injection once a month, Disp: , Rfl:  .  NON FORMULARY, Immune support OTC vitamin c 1000 mg daily, Disp: , Rfl:  .  tamsulosin (FLOMAX) 0.4 MG CAPS capsule, Take 0.8 mg by mouth. , Disp: , Rfl:  .  VENTOLIN HFA 108 (90 Base) MCG/ACT inhaler, , Disp: , Rfl:  .  lisinopril-hydrochlorothiazide (PRINZIDE,ZESTORETIC) 20-25 MG tablet, Take 1 tablet by mouth daily., Disp: , Rfl:    Review of Systems     Objective:   Physical Exam  Constitutional: He is oriented to person, place, and time. He appears well-developed and well-nourished. No distress.  HENT:  Head: Normocephalic and atraumatic.  Right Ear: External ear normal.  Left Ear: External ear normal.  Mouth/Throat: Oropharynx is clear and moist. No oropharyngeal exudate.  mallampatti class 3  Eyes: Pupils are equal, round, and reactive to light. Conjunctivae and EOM are normal. Right eye exhibits no discharge. Left eye exhibits no discharge. No scleral icterus.  Neck: Normal range of motion. Neck supple. No JVD present. No tracheal deviation present. No thyromegaly present.  Cardiovascular: Normal rate, regular rhythm and intact distal pulses. Exam reveals no gallop and no friction rub.  No murmur heard. Pulmonary/Chest: Effort normal and breath sounds normal. No respiratory distress. He has no wheezes. He has no rales. He exhibits no tenderness.  Abdominal: Soft. Bowel sounds are normal. He exhibits no distension and no mass. There is no tenderness. There is no rebound and no guarding.  Visceral obesity   Musculoskeletal: Normal range of motion. He exhibits no edema or tenderness.  Lymphadenopathy:    He has no cervical adenopathy.  Neurological: He is alert and oriented to person, place, and time. He has normal reflexes. No cranial nerve deficit. Coordination normal.  Skin: Skin is warm and dry. No rash noted. He is not diaphoretic. No erythema. No pallor.  Psychiatric: He has a normal mood and affect. His behavior is normal. Judgment and thought content normal.  Nursing note and vitals reviewed.  Vitals:   09/10/17 1149  BP: (!) 142/68  Pulse: 77  SpO2: 96%  Weight: 184 lb 9.6 oz (83.7 kg)  Height: 5\' 2"  (1.575 m)    Estimated body mass index is 33.76 kg/m as calculated from the following:   Height as of this encounter: 5\' 2"  (1.575 m).   Weight as of this encounter: 184 lb 9.6 oz (83.7 kg).      Assessment:       ICD-10-CM   1. Chronic cough R05   2. Irritable larynx J38.7        Plan:       ICD-10-CM   1. Chronic cough R05 Nitric oxide  2. Irritable larynx J38.7   3. History of snoring Z87.898   4. Obesity (BMI 30.0-34.9) E66.9    Chronic cough -  Irritable larynx  - likekly cough variant asthma  - although superimposed cough neuropathy could be going on - do Please take prednisone 40 mg x1 day, then 30 mg x1 day, then 20 mg x1 day, then 10 mg x1 day, and then 5 mg x1 day and stop - start asmanex 2 puff twice daily - inhaled steroids  - use albuterol as needed - check cbc with diff and blood IgE 09/10/2017  = start/ take generic fluticasone inhaler 2 squirts each nostril daily - never take lisinopril   History of snoring Obesity (BMI 30.0-34.9)  - refer our sleep doc  Followup With an APP in 4-6 weeks to report progress; RSI cough score at followup  - if not improving will consider CT chest/sinus abnd trial gabapentin   Dr. Brand Males, M.D., [facility].C.P Pulmonary and Critical Care Medicine Staff Physician, [place] Director -  Interstitial Lung Disease  Program  [facility] [place] [facility] [place], [place], [address]  Pager: [phone], If no answer or between  15:00h - 7:00h: call [phone] Telephone: [phone]

## 2017-09-10 NOTE — Progress Notes (Signed)
   Subjective:    Patient ID: Gregory Ewing, male    DOB: 1945/11/06, 72 y.o.   MRN: [id number]  HPI    Review of Systems  Constitutional: Negative for fever and unexpected weight change.  HENT: Positive for congestion and dental problem. Negative for ear pain, nosebleeds, postnasal drip, rhinorrhea, sinus pressure, sneezing, sore throat and trouble swallowing.   Eyes: Negative for redness and itching.  Respiratory: Positive for cough, shortness of breath and wheezing. Negative for chest tightness.   Cardiovascular: Negative for palpitations and leg swelling.  Gastrointestinal: Negative for nausea and vomiting.  Genitourinary: Negative for dysuria.  Musculoskeletal: Negative for joint swelling.  Skin: Negative for rash.  Allergic/Immunologic: Negative.  Negative for environmental allergies, food allergies and immunocompromised state.  Neurological: Negative for headaches.  Hematological: Bruises/bleeds easily.  Psychiatric/Behavioral: Negative for dysphoric mood.       Objective:   Physical Exam        Assessment & Plan:

## 2017-09-13 LAB — IGE: IgE (Immunoglobulin E), Serum: <2 kU/L (ref <=114)

## 2017-09-16 ENCOUNTER — Telehealth: Payer: Self-pay

## 2017-09-16 NOTE — Telephone Encounter (Signed)
I meant to route this to [facility]

## 2017-09-16 NOTE — Telephone Encounter (Signed)
  PA on Tudorza initiated through [facility]. Will route to EP for f/u   Thurnell Lose (Key: [id number])    Your information has been sent to [facility].

## 2017-09-22 NOTE — Telephone Encounter (Signed)
Called cover my meds, informed there systems we down and they would call back this afternoon to follow up on PA for tudorza. Key listed for PA was unable to be located in system online so customer service was contacted.

## 2017-09-23 NOTE — Telephone Encounter (Signed)
Provided key for CMM is still showing incorrect when entered into CMM. I have called CMM and spoke to [facility]. Alex states that pt's last name was entered incorrectly.  I was able to view PA determination. PA for Asmanex been approved through 04/12/18. Per MR's last OV note pt was to start Asmanex 60, which is the medication PA was started for.  It appears that medication name was entered incorrectly within telephone encounter.   lmtcb x1 for pt to make aware. Pharmacy is aware.

## 2017-09-23 NOTE — Telephone Encounter (Signed)
Gregory Ewing with Cover My Meds calling a back. Per Gregory Ewing the key for PA of [name] is [id number]. Cb is [phone]

## 2017-09-24 ENCOUNTER — Telehealth: Payer: Self-pay | Admitting: Internal Medicine

## 2017-09-24 NOTE — Telephone Encounter (Signed)
Spoke with pt's daughter, [name]. She is aware that pt's Asmanex has been approved. She states that this medication is to expensive for him. I have advised her to get a copy of the pt's drug formulary. She will get this and bring it to our office. Will await this information.

## 2017-09-28 NOTE — Telephone Encounter (Signed)
Attempted to call pt's daughter [name] but unable to reach her.  Left message for [name] to return call x2

## 2017-09-29 NOTE — Telephone Encounter (Signed)
lmtcb for daughter [name].  No formulary has been received yet.

## 2017-09-30 NOTE — Telephone Encounter (Signed)
lmtcb for pt's daughter, [name].

## 2017-10-01 ENCOUNTER — Telehealth: Payer: Self-pay | Admitting: Internal Medicine

## 2017-10-01 NOTE — Telephone Encounter (Signed)
Attempted to call pt's daughter [name] but unable to reach her.  Left message for [name] to return call x3.  Due to multiple attempts trying to reach [facility], per triage protocol message will be closed.

## 2017-10-01 NOTE — Telephone Encounter (Signed)
Please see previously message.  Called and spoke to pt's daughter, [name]. [name] stateD that she spoke with [facility], and was advised that our office is within network. [name] stated that the [place] will be contacting our office within 7-10d to schedule OV. Nothing further is needed at this time.  Will route to MR as an FYI.

## 2017-10-01 NOTE — Telephone Encounter (Signed)
Previous message that was closed was to see if pt or pt's daughter Gregory Ewing have been able to get a formulary list from insurance.  Called and spoke with Gregory Ewing who stated they have not gotten a formulary list but pt went to [place] stated the [place] is trying to find a place outside of the [place] and is going to discuss with the [place] if pt can be seen by a pulmonary doctor that is outside of the [place] and if so, then if meds can be filled and sent to the [place] we could do that.  Once [place] hears from the [place] if all is good, she would call us back and let us know.  Will close this current open encounter.

## 2017-10-11 ENCOUNTER — Telehealth: Payer: Self-pay | Admitting: Internal Medicine

## 2017-10-11 NOTE — Telephone Encounter (Signed)
Please see 10/01/17 phone note.  Called and spoke to pt's daughter, [name] ([place]).  [name] is aware that [place] has not reached out to our office as of yet to schedule OV for pt.  [name] stated she would contact [place], and will call us back with a update.

## 2017-10-12 ENCOUNTER — Ambulatory Visit: Payer: Non-veteran care | Admitting: Adult Health

## 2017-10-13 NOTE — Telephone Encounter (Signed)
[facility], unable to reach. Left message to give [place] a call back.

## 2017-10-15 ENCOUNTER — Telehealth: Payer: Self-pay | Admitting: Internal Medicine

## 2017-10-15 NOTE — Telephone Encounter (Signed)
Please see previously message.  Called and spoke to pt's daughter, [name]. [name] stateD that she spoke with [facility], and was advised that our office is within network. [name] stated that the [place] will be contacting our office within 7-10d to schedule OV. Nothing further is needed at this time.  Will route to MR as an FYI.  Will close this message as the patient has already been scheduled with Dr. Halford Chessman.

## 2017-10-15 NOTE — Telephone Encounter (Signed)
Called and spoke to pt's daughter, [name].  [name] stated that pt received call from [place], stating that pt was approved to see [facility]. [name] stated that the [place] was to send a referral. [name] is wanting to know if pt should keep scheduled consult with [facility] on 10/20/17.  Patrice have you received a referral for pt?

## 2017-10-20 ENCOUNTER — Institutional Professional Consult (permissible substitution): Payer: Non-veteran care | Admitting: Pulmonary Disease

## 2017-10-20 NOTE — Telephone Encounter (Signed)
Referral Notes  Number of Notes: 2  Type Date User Summary Attachment  General 09/28/2017 4:20 PM Rattray, Lacie Draft - -  Note   Patient has [facility] choice based on last visit with Dr. Chase Caller - faxed referral request to [facility] for [place]. - closed referral as it is not needed - Patient scheduled with Dr. Halford Chessman on 10/20/17-pr         Type Date User Summary Attachment  Provider Comments 09/10/2017 1:01 PM Jannette Spanner, CMA Provider Comments -  Note   Sleep doctor per MR Dx: snoring          Spoke with Melinda-she is aware that apt for today is okay to keep per above documentation. Nothing more needed at this time.

## 2017-10-21 ENCOUNTER — Ambulatory Visit (INDEPENDENT_AMBULATORY_CARE_PROVIDER_SITE_OTHER): Payer: Non-veteran care | Admitting: Internal Medicine

## 2017-10-21 ENCOUNTER — Encounter: Payer: Self-pay | Admitting: Internal Medicine

## 2017-10-21 VITALS — BP 130/74 | HR 86 | Ht 62.0 in | Wt 183.8 lb

## 2017-10-21 DIAGNOSIS — G4733 Obstructive sleep apnea (adult) (pediatric): Secondary | ICD-10-CM | POA: Diagnosis not present

## 2017-10-21 DIAGNOSIS — C61 Malignant neoplasm of prostate: Secondary | ICD-10-CM | POA: Diagnosis not present

## 2017-10-21 NOTE — Progress Notes (Signed)
10/21/2017-72 year old male, Veteran,  never smoker- for sleep evaluation.  Dr. Arelia Sneddon, PCP, had referred him to Dr. Chase Caller for cough with COPD.  History was obtained suggesting OSA. ----Pt's family does report that pt does snore at night. Pt states when he wakes up in the morning, he is still sleepy and states he wants to go back to sleep. Pt has never had a sleep study performed. Medical problem list includes HBP, "irritable larynx", prostate cancer, Epworth sleepiness score 9 Bedtime 4AM, up 1 PM.  Frequent nocturia. Told that he snores loudly.  Stable long-term sleep pattern: Goes to sleep around 4 AM after watching TV, short sleep latency.  Bathroom 4 or 5 times for nocturia before up at 1 PM.  He denies parasomnias.  Says he takes Celexa for anxiety, not for sleep.  Only decaf coffee and drinks.  He denies heart or lung problems and says previous cough is gone.  A daughter has OSA.  Cecilio Asper Past Medical History:  Diagnosis Date  . Astigmatism   . Family history of breast cancer   . Hyperlipidemia   . Hypertension   . Leukocytosis   . LFTs abnormal   . Presbyopia   . Prostate cancer [facility])    Past Surgical History:  Procedure Laterality Date  . APPENDECTOMY    . BACK SURGERY    . partial small bowel resection    . TONSILLECTOMY     Family History  Problem Relation Age of Onset  . Cancer Mother 30       breast  . Diabetes Sister   . Cancer Brother        throat/hx of smoking and drinking  . Diabetes Daughter        d. 45  . Heart attack Paternal Uncle   . Heart attack Maternal Grandmother   . Heart attack Maternal Grandfather   . Heart attack Paternal Grandmother   . Heart attack Paternal Grandfather   . Throat cancer Brother   . Cervical cancer Daughter    Social History   Socioeconomic History  . Marital status: Single    Spouse name: Not on file  . Number of children: Not on file  . Years of education: Not on file  . Highest education level: Not on file   Occupational History  . Not on file  Social Needs  . Financial resource strain: Not on file  . Food insecurity:    Worry: Not on file    Inability: Not on file  . Transportation needs:    Medical: Not on file    Non-medical: Not on file  Tobacco Use  . Smoking status: Never Smoker  . Smokeless tobacco: Never Used  Substance and Sexual Activity  . Alcohol use: No  . Drug use: No  . Sexual activity: Not Currently  Lifestyle  . Physical activity:    Days per week: Not on file    Minutes per session: Not on file  . Stress: Not on file  Relationships  . Social connections:    Talks on phone: Not on file    Gets together: Not on file    Attends religious service: Not on file    Active member of club or organization: Not on file    Attends meetings of clubs or organizations: Not on file    Relationship status: Not on file  . Intimate partner violence:    Fear of current or ex partner: Not on file    Emotionally abused: Not  on file    Physically abused: Not on file    Forced sexual activity: Not on file  Other Topics Concern  . Not on file  Social History Narrative  . Not on file   ROS-see HPI   +  = positive Constitutional:    weight loss, night sweats, fevers, chills, fatigue, lassitude. HEENT:    headaches, difficulty swallowing, +tooth/dental problems, sore throat,       sneezing, itching, ear ache, nasal congestion, post nasal drip, snoring CV:    chest pain, orthopnea, PND, swelling in lower extremities, anasarca,                                  dizziness, palpitations Resp:   +shortness of breath with exertion or at rest.               + productive cough,   non-productive cough, coughing up of blood.              change in color of mucus.  wheezing.   Skin:    rash or lesions. GI:  No-   heartburn, indigestion, abdominal pain, nausea, vomiting, diarrhea,                 change in bowel habits, loss of appetite GU: dysuria, change in color of urine, no urgency or  frequency.   flank pain. MS:   +joint pain, stiffness, decreased range of motion, back pain. Neuro-     nothing unusual Psych:  change in mood or affect.  depression or anxiety.   memory loss.  OBJ- Physical Exam General- Alert, Oriented, Affect-appropriate, Distress- none acute + overweight Skin- rash-none, lesions- none, excoriation- none Lymphadenopathy- none Head- atraumatic            Eyes- Gross vision intact, PERRLA, conjunctivae and secretions clear            Ears- + HOH            Nose- Clear, no-Septal dev, mucus, polyps, erosion, perforation             Throat- Mallampati II-III , mucosa clear , drainage- none, tonsils- atrophic Neck- flexible , trachea midline, no stridor , thyroid nl, carotid no bruit Chest - symmetrical excursion , unlabored           Heart/CV- RRR , no murmur , no gallop  , no rub, nl s1 s2                           - JVD- none , edema- none, stasis changes- none, varices- none           Lung- clear to P&A, wheeze- none, cough- none , dullness-none, rub- none           Chest wall-  Abd-  Br/ Gen/ Rectal- Not done, not indicated Extrem- cyanosis- none, clubbing, none, atrophy- none, strength- nl Neuro- grossly intact to observation

## 2017-10-21 NOTE — Patient Instructions (Signed)
Order- please schedule unattended home sleep test    Dx OSA  Please call me about 2 weeks after your sleep test for results and recommendations. If appropriate, we might be able to start treatment before I see you next.

## 2017-10-24 DIAGNOSIS — G4733 Obstructive sleep apnea (adult) (pediatric): Secondary | ICD-10-CM | POA: Insufficient documentation

## 2017-10-24 NOTE — Assessment & Plan Note (Signed)
Active diagnosis based on history and physical.  We discussed basic sleep hygiene including his delayed sleep phase pattern which he says is a habit.  Reviewed basic physiology of OSA, medical concerns, treatments, responsibility to drive safely. Plan-sleep study

## 2017-10-24 NOTE — Assessment & Plan Note (Signed)
Treatment, he is left with a urinary continence and frequent nocturia causing sleep disturbance.  He is followed by urology.

## 2017-11-09 DIAGNOSIS — G4733 Obstructive sleep apnea (adult) (pediatric): Secondary | ICD-10-CM | POA: Diagnosis not present

## 2017-11-10 ENCOUNTER — Other Ambulatory Visit: Payer: Self-pay | Admitting: *Deleted

## 2017-11-10 DIAGNOSIS — G4733 Obstructive sleep apnea (adult) (pediatric): Secondary | ICD-10-CM

## 2017-11-24 ENCOUNTER — Institutional Professional Consult (permissible substitution): Payer: Non-veteran care | Admitting: Pulmonary Disease

## 2018-01-24 ENCOUNTER — Ambulatory Visit: Payer: Medicare HMO | Admitting: Internal Medicine

## 2018-01-24 ENCOUNTER — Encounter: Payer: Self-pay | Admitting: Internal Medicine

## 2018-01-24 DIAGNOSIS — G4733 Obstructive sleep apnea (adult) (pediatric): Secondary | ICD-10-CM

## 2018-01-24 NOTE — Patient Instructions (Signed)
You have very mild obstructive sleep apnea, with an AHI score of 7.4 apneas/ hour. We could use CPAP or a fitted oral appliance. Conservative measures might include weight loss, sleep position off back.  You need to see a dentist any way, so you might ask him if he has any experience with fitted oral appliances for treating Sleep apnea.   Please call if we can help

## 2018-01-24 NOTE — Progress Notes (Signed)
10/21/2017-72 year old male, Veteran,  never smoker- for sleep evaluation.  Dr. Arelia Sneddon, PCP, had referred him to Dr. Chase Caller for cough with COPD.  History was obtained suggesting OSA. ----Pt's family does report that pt does snore at night. Pt states when he wakes up in the morning, he is still sleepy and states he wants to go back to sleep. Pt has never had a sleep study performed. Medical problem list includes HBP, "irritable larynx", prostate cancer, Epworth sleepiness score 9 Bedtime 4AM, up 1 PM.  Frequent nocturia. Told that he snores loudly.  Stable long-term sleep pattern: Goes to sleep around 4 AM after watching TV, short sleep latency.  Bathroom 4 or 5 times for nocturia before up at 1 PM.  He denies parasomnias.  Says he takes Celexa for anxiety, not for sleep.  Only decaf coffee and drinks.  He denies heart or lung problems and says previous cough is gone.  A daughter has OSA.  01/24/2018-72 year old male, Veteran, never smoker-followed for OSA, complicated by HBP, "irritable larynx", prostate cancer, anxiety HST 11/09/2017-AHI 7.4/hour, desaturation to 88%, body weight 188 pounds -----Follows for: OSA, discuss HST results Ventolin HFA, Asmanex, Here with his son.  He continues sleep habit of watching cowboy shows all night until 4 AM and then sleeping until noon.  This does not seem to disturb him or bother his quality of life much.  Frequent nocturia is more disruptive and he manages this with urology.  We discussed his very mild OSA, noting that CPAP or an oral appliance might be more disruptive beneficial.  Conservative measures including weight loss may be better.  ROS-see HPI   +  = positive Constitutional:    weight loss, night sweats, fevers, chills, fatigue, lassitude. HEENT:    headaches, difficulty swallowing, +tooth/dental problems, sore throat,       sneezing, itching, ear ache, nasal congestion, post nasal drip, snoring CV:    chest pain, orthopnea, PND, swelling in lower  extremities, anasarca,                                  dizziness, palpitations Resp:   +shortness of breath with exertion or at rest.               + productive cough,   non-productive cough, coughing up of blood.              change in color of mucus.  wheezing.   Skin:    rash or lesions. GI:  No-   heartburn, indigestion, abdominal pain, nausea, vomiting, diarrhea,                 change in bowel habits, loss of appetite GU: dysuria, change in color of urine, no urgency or frequency.   flank pain. MS:   +joint pain, stiffness, decreased range of motion, back pain. Neuro-     nothing unusual Psych:  change in mood or affect.  depression or anxiety.   memory loss.  OBJ- Physical Exam General- Alert, Oriented, Affect-appropriate, Distress- none acute + overweight Skin- rash-none, lesions- none, excoriation- none Lymphadenopathy- none Head- atraumatic            Eyes- Gross vision intact, PERRLA, conjunctivae and secretions clear            Ears- + HOH            Nose- Clear, no-Septal dev, mucus, polyps, erosion, perforation  Throat- Mallampati II-III , mucosa clear , drainage- none, tonsils- atrophic Partial plate Neck- flexible , trachea midline, no stridor , thyroid nl, carotid no bruit Chest - symmetrical excursion , unlabored           Heart/CV- RRR , no murmur , no gallop  , no rub, nl s1 s2                           - JVD- none , edema- none, stasis changes- none, varices- none           Lung- clear to P&A, wheeze- none, cough- none , dullness-none, rub- none           Chest wall-  Abd-  Br/ Gen/ Rectal- Not done, not indicated Extrem- cyanosis- none, clubbing, none, atrophy- none, strength- nl Neuro- grossly intact to observation

## 2018-01-24 NOTE — Assessment & Plan Note (Signed)
I suggested that conservative management including weight loss, sleep off flat of back would do more to improve his quality of life.  Adding CPAP when he has to get up so frequently for bathroom, is not likely to be helpful for such mild OSA.  This can be reconsidered.  His partial plate may not rule out an oral appliance, as discussed.

## 2018-03-18 DIAGNOSIS — R69 Illness, unspecified: Secondary | ICD-10-CM | POA: Diagnosis not present

## 2018-05-05 ENCOUNTER — Encounter: Payer: Self-pay | Admitting: Gastroenterology

## 2018-05-30 ENCOUNTER — Encounter (INDEPENDENT_AMBULATORY_CARE_PROVIDER_SITE_OTHER): Payer: Self-pay

## 2018-05-30 ENCOUNTER — Encounter: Payer: Self-pay | Admitting: Gastroenterology

## 2018-05-30 ENCOUNTER — Ambulatory Visit (INDEPENDENT_AMBULATORY_CARE_PROVIDER_SITE_OTHER): Payer: No Typology Code available for payment source | Admitting: Gastroenterology

## 2018-05-30 VITALS — Ht 63.0 in | Wt 189.1 lb

## 2018-05-30 DIAGNOSIS — D509 Iron deficiency anemia, unspecified: Secondary | ICD-10-CM

## 2018-05-30 MED ORDER — NA SULFATE-K SULFATE-MG SULF 17.5-3.13-1.6 GM/177ML PO SOLN: 1.0000 | Freq: Once | ORAL | 0 refills | Status: AC

## 2018-05-30 NOTE — Patient Instructions (Signed)
If you are age 72 or older, your body mass index should be between 23-30. Your Body mass index is 33.5 kg/m. If this is out of the aforementioned range listed, please consider follow up with your Primary Care Provider.  If you are age 82 or younger, your body mass index should be between 19-25. Your Body mass index is 33.5 kg/m. If this is out of the aformentioned range listed, please consider follow up with your Primary Care Provider.   You have been scheduled for an endoscopy and colonoscopy. Please follow the written instructions given to you at your visit today. Please pick up your prep supplies at the pharmacy within the next 1-3 days. If you use inhalers (even only as needed), please bring them with you on the day of your procedure. Your physician has requested that you go to www.startemmi.com and enter the access code given to you at your visit today. This web site gives a general overview about your procedure. However, you should still follow specific instructions given to you by our office regarding your preparation for the procedure.  It was a pleasure to see you today!  Dr. Loletha Carrow

## 2018-05-30 NOTE — Progress Notes (Signed)
[facility] Consult Note:  History: Gregory Ewing 05/30/2018  Referring physician: Chipper Herb, PA ([facility], [place], [place])  Reason for consult/chief complaint: Anemia (to discuss having a ECL)   Subjective  HPI:  This is a very pleasant 73 year old man referred by the GI clinic at the local [facility] for reported iron deficiency anemia.  [facility] indicate [facility] for iron deficiency anemia with previous colonoscopy in 2014 "without findings".  Patient reportedly was referred to [place] because he cannot have endoscopic procedures at the [facility] facility with a Mallampati score of 4.  [facility] note by Gregory Herb, PA dated 04/11/2018 seen for iron deficiency anemia.  That provider reports the patient was in hematology clinic on December 5 with a hemoglobin of 12.2 and MCV of 80.5.  He has reportedly been anemic since January 2019 with a progressively decreasing MCV.  Studies showed low iron and low saturation.  Was started on iron sulfate 325 mg twice daily about a year ago.  Gregory Ewing feels well, and denies abdominal pain, nausea, vomiting, early satiety, weight loss, altered bowel habits or rectal bleeding. He does not recall what hematology evaluation was done, but says he has been on B12 shots for over a year.    ROS:  Review of Systems  Constitutional: Negative for appetite change and unexpected weight change.  HENT: Negative for mouth sores and voice change.   Eyes: Negative for pain and redness.  Respiratory: Negative for cough and shortness of breath.   Cardiovascular: Negative for chest pain and palpitations.  Genitourinary: Negative for dysuria and hematuria.  Musculoskeletal: Negative for arthralgias and myalgias.  Skin: Negative for pallor and rash.  Neurological: Negative for weakness and headaches.  Hematological: Negative for adenopathy.     Past Medical History: Past Medical History:  Diagnosis Date  . Arthritis   .  Astigmatism   . Family history of breast cancer   . Hyperlipidemia   . Hypertension   . Leukocytosis   . LFTs abnormal   . Obstructive sleep apnea   . Presbyopia   . Prostate cancer [facility])      Past Surgical History: Past Surgical History:  Procedure Laterality Date  . APPENDECTOMY    . LUMBAR DISC SURGERY    . partial small bowel resection    . TONSILLECTOMY     He reports having had some of the small bowel removed in the 1970s for "ileitis".  Family History: Family History  Problem Relation Age of Onset  . Breast cancer Mother 52  . Diabetes Daughter        d. 53  . Heart attack Paternal Uncle   . Heart attack Maternal Grandmother   . Heart attack Maternal Grandfather   . Heart attack Paternal Grandmother   . Heart attack Paternal Grandfather   . Throat cancer Brother        smoker and drinker  . Heart attack Brother   . Cervical cancer Daughter   . Asthma Granddaughter   . Colon cancer Neg Hx   . Pancreatic cancer Neg Hx     Social History: Social History   Socioeconomic History  . Marital status: Single    Spouse name: Not on file  . Number of children: 3  . Years of education: Not on file  . Highest education level: Not on file  Occupational History  . Occupation: retired  Scientific laboratory technician  . Financial resource strain: Not on file  . Food insecurity:  Worry: Not on file    Inability: Not on file  . Transportation needs:    Medical: Not on file    Non-medical: Not on file  Tobacco Use  . Smoking status: Never Smoker  . Smokeless tobacco: Never Used  Substance and Sexual Activity  . Alcohol use: No  . Drug use: No  . Sexual activity: Not Currently  Lifestyle  . Physical activity:    Days per week: Not on file    Minutes per session: Not on file  . Stress: Not on file  Relationships  . Social connections:    Talks on phone: Not on file    Gets together: Not on file    Attends religious service: Not on file    Active member of club or  organization: Not on file    Attends meetings of clubs or organizations: Not on file    Relationship status: Not on file  Other Topics Concern  . Not on file  Social History Narrative  . Not on file    Allergies: No Known Allergies  Outpatient Meds: Current Outpatient Medications  Medication Sig Dispense Refill  . acetaminophen (TYLENOL) 500 MG tablet Take 1,000 mg by mouth 4 (four) times daily.    Marland Kitchen aspirin 81 MG tablet Take 81 mg by mouth daily.    . Calcium Carb-Cholecalciferol (CALCIUM/VITAMIN D) 600-400 MG-UNIT TABS Take by mouth.    . cetirizine (ZYRTEC) 10 MG tablet Take 10 mg by mouth daily.    . citalopram (CELEXA) 40 MG tablet Take 40 mg by mouth daily. 1/2 tab daily    . diphenoxylate-atropine (LOMOTIL) 2.5-0.025 MG tablet Take 1-2 tablets by mouth 4 (four) times daily as needed for diarrhea or loose stools. 90 tablet 0  . fluticasone (FLONASE) 50 MCG/ACT nasal spray Place 2 sprays into both nostrils daily.    Marland Kitchen Leuprolide Acetate, 3 Month, (LUPRON DEPOT, 70-MONTH, IM) Inject into the muscle every 3 (three) months.    . lovastatin (MEVACOR) 20 MG tablet Take 20 mg by mouth at bedtime.    . mometasone (ASMANEX 60 METERED DOSES) 220 MCG/INH inhaler Inhale 2 puffs into the lungs daily. 2 Inhaler 0  . NON FORMULARY B12 injection once a month    . NON FORMULARY Immune support OTC vitamin c 1000 mg daily    . tamsulosin (FLOMAX) 0.4 MG CAPS capsule Take 0.8 mg by mouth.     . VENTOLIN HFA 108 (90 Base) MCG/ACT inhaler     . Na Sulfate-K Sulfate-Mg Sulf 17.5-3.13-1.6 GM/177ML SOLN Take 1 kit by mouth once for 1 dose. 354 mL 0   No current facility-administered medications for this visit.       ___________________________________________________________________ Objective   Exam:  Ht _0  (1.6 m) Comment: height measured without shoes  Wt 189 lb 2 oz (85.8 kg)   BMI 33.50 kg/m  His daughter is present for the entire encounter.  General: Well-appearing man, normal  vocal quality  Eyes: sclera anicteric, no redness  ENT: oral mucosa moist without lesions, no cervical or supraclavicular lymphadenopathy.  He is actually a class II-III airway  CV: RRR without murmur, S1/S2, no JVD, no peripheral edema  Resp: clear to auscultation bilaterally, normal RR and effort noted  GI: soft, no tenderness, with active bowel sounds. No guarding or palpable organomegaly noted.  Lower abdominal midline scar  Skin; warm and dry, no rash or jaundice noted  Neuro: awake, alert and oriented x 3. Normal gross motor function and  fluent speech  Labs:  Creatinine 1.0, hemoglobin 12.2 on 03/17/2018  2014 colonoscopy report was not provided despite records request from our clinic.   Assessment: Encounter Diagnosis  Name Primary?  . Iron deficiency anemia, unspecified iron deficiency anemia type Yes    Very mild anemia with reported iron deficiency.  Not clear if this is from chronic GI blood loss iron malabsorption or other chronic disease.  Plan:  Upper endoscopy and colonoscopy.  He is agreeable after discussion of procedure and risks.  The benefits and risks of the planned procedure were described in detail with the patient or (when appropriate) their health care proxy.  Risks were outlined as including, but not limited to, bleeding, infection, perforation, adverse medication reaction leading to cardiac or pulmonary decompensation, or pancreatitis (if ERCP).  The limitation of incomplete mucosal visualization was also discussed.  No guarantees or warranties were given.  If unrevealing for any source of GI blood loss, refer back to [facility] clinic and hematology to follow and treat as needed.  Thank you for the courtesy of this consult.  Please call me with any questions or concerns.  Nelida Meuse III  CC: Referring provider noted above

## 2018-06-09 ENCOUNTER — Telehealth: Payer: Self-pay | Admitting: Gastroenterology

## 2018-06-09 NOTE — Telephone Encounter (Signed)
Pt daughter called states the the prep has not yet been sent to the [facility] in [place] phone# [phone].  Colon apt 06-13-18 @2pm  w- Dr. Loletha Carrow

## 2018-06-10 ENCOUNTER — Telehealth: Payer: Self-pay | Admitting: Gastroenterology

## 2018-06-10 ENCOUNTER — Other Ambulatory Visit: Payer: Self-pay

## 2018-06-10 MED ORDER — NA SULFATE-K SULFATE-MG SULF 17.5-3.13-1.6 GM/177ML PO SOLN: 1.0000 | Freq: Once | ORAL | 0 refills | Status: AC

## 2018-06-10 MED ORDER — NA SULFATE-K SULFATE-MG SULF 17.5-3.13-1.6 GM/177ML PO SOLN: 1.0000 | Freq: Once | ORAL | 0 refills | Status: DC

## 2018-06-10 NOTE — Telephone Encounter (Signed)
Patient informed that we have a sample of suprep and he is on his way to come get it.

## 2018-06-10 NOTE — Telephone Encounter (Signed)
Pt stated that [facility] still has not received prep prescription.  Please send prescription to [facility] on [address] in [place].

## 2018-06-10 NOTE — Telephone Encounter (Signed)
W[place] from pharm call to advised that they do not have the su prep but they have others that can be giving to the pt.

## 2018-06-10 NOTE — Telephone Encounter (Signed)
Prep sent to [facility] clinic as requested

## 2018-06-13 ENCOUNTER — Encounter: Payer: Self-pay | Admitting: Gastroenterology

## 2018-06-13 ENCOUNTER — Ambulatory Visit (AMBULATORY_SURGERY_CENTER): Payer: No Typology Code available for payment source | Admitting: Gastroenterology

## 2018-06-13 ENCOUNTER — Other Ambulatory Visit: Payer: Self-pay

## 2018-06-13 VITALS — BP 108/55 | HR 75 | Temp 98.4°F | Resp 12 | Ht 63.0 in | Wt 189.0 lb

## 2018-06-13 DIAGNOSIS — K5730 Diverticulosis of large intestine without perforation or abscess without bleeding: Secondary | ICD-10-CM | POA: Diagnosis not present

## 2018-06-13 DIAGNOSIS — D509 Iron deficiency anemia, unspecified: Secondary | ICD-10-CM

## 2018-06-13 MED ORDER — SODIUM CHLORIDE 0.9 % IV SOLN: 500.0000 mL | Freq: Once | INTRAVENOUS | Status: DC

## 2018-06-13 NOTE — Op Note (Signed)
[facility] Patient Name: Gregory Ewing Procedure Date: 06/13/2018 2:01 PM MRN: [id number] Endoscopist: Mallie Mussel L. Loletha Carrow , MD Age: 73 Referring MD:  Date of Birth: 03-28-1946 Gender: Male Account #: [id number] Procedure:                Upper GI endoscopy Indications:              Iron deficiency anemia Medicines:                Monitored Anesthesia Care Procedure:                Pre-Anesthesia Assessment:                           - Prior to the procedure, a History and Physical                            was performed, and patient medications and                            allergies were reviewed. The patient's tolerance of                            previous anesthesia was also reviewed. The risks                            and benefits of the procedure and the sedation                            options and risks were discussed with the patient.                            All questions were answered, and informed consent                            was obtained. Anticoagulants: The patient has taken                            aspirin. It was decided not to withhold this                            medication prior to the procedure. ASA Grade                            Assessment: II - A patient with mild systemic                            disease. After reviewing the risks and benefits,                            the patient was deemed in satisfactory condition to                            undergo the procedure.                           -  Prior to the procedure, a History and Physical                            was performed, and patient medications and                            allergies were reviewed. The patient's tolerance of                            previous anesthesia was also reviewed. The risks                            and benefits of the procedure and the sedation                            options and risks were discussed with the patient.               All questions were answered, and informed consent                            was obtained. Anticoagulants: The patient has taken                            aspirin. It was decided not to withhold this                            medication prior to the procedure. ASA Grade                            Assessment: II - A patient with mild systemic                            disease. After reviewing the risks and benefits,                            the patient was deemed in satisfactory condition to                            undergo the procedure.                           After obtaining informed consent, the endoscope was                            passed under direct vision. Throughout the                            procedure, the patient's blood pressure, pulse, and                            oxygen saturations were monitored continuously. The                            Endoscope was introduced  through the mouth, and                            advanced to the second part of duodenum. The upper                            GI endoscopy was accomplished without difficulty.                            The patient tolerated the procedure well. Scope In: Scope Out: Findings:                 The esophagus was normal.                           The stomach was normal.                           The cardia and gastric fundus were normal on                            retroflexion.                           The examined duodenum was normal. Complications:            No immediate complications. Estimated Blood Loss:     Estimated blood loss: none. Impression:               - Normal esophagus.                           - Normal stomach.                           - Normal examined duodenum.                           - No specimens collected. Recommendation:           - Patient has a contact number available for                            emergencies. The signs and symptoms of potential                             delayed complications were discussed with the                            patient. Return to normal activities tomorrow.                            Written discharge instructions were provided to the                            patient.                           - Resume previous diet.                           -  Continue present medications.                           - See the other procedure note for documentation of                            additional recommendations.                           - Return to referring physician. Ellasyn Swilling L. Loletha Carrow, MD 06/13/2018 2:45:24 PM This report has been signed electronically.

## 2018-06-13 NOTE — Op Note (Signed)
[facility] Patient Name: Gregory Ewing Procedure Date: 06/13/2018 2:00 PM MRN: [id number] Endoscopist: Mallie Mussel L. Loletha Carrow , MD Age: 73 Referring MD:  Date of Birth: [birth year] Gender: Male Account #: [id number] Procedure:                Colonoscopy Indications:              Iron deficiency anemia (under care of both GI and                            Hematology clinic at [facility]; also B12 deficient) Medicines:                Monitored Anesthesia Care Procedure:                Pre-Anesthesia Assessment:                           - Prior to the procedure, a History and Physical                            was performed, and patient medications and                            allergies were reviewed. The patient's tolerance of                            previous anesthesia was also reviewed. The risks                            and benefits of the procedure and the sedation                            options and risks were discussed with the patient.                            All questions were answered, and informed consent                            was obtained. Anticoagulants: The patient has taken                            aspirin. It was decided not to withhold this                            medication prior to the procedure. ASA Grade                            Assessment: II - A patient with mild systemic                            disease. After reviewing the risks and benefits,                            the patient was deemed in satisfactory condition to  undergo the procedure.                           After obtaining informed consent, the colonoscope                            was passed under direct vision. Throughout the                            procedure, the patient's blood pressure, pulse, and                            oxygen saturations were monitored continuously. The                            Colonoscope was introduced through the  anus and                            advanced to the the cecum, identified by                            appendiceal orifice and ileocecal valve. The                            colonoscopy was performed without difficulty. The                            patient tolerated the procedure well. The quality                            of the bowel preparation was excellent. The                            ileocecal valve, appendiceal orifice, and rectum                            were photographed. Scope In: 2:15:18 PM Scope Out: 2:27:36 PM Scope Withdrawal Time: 0 hours 8 minutes 41 seconds  Total Procedure Duration: 0 hours 12 minutes 18 seconds  Findings:                 The perianal and digital rectal examinations were                            normal.                           There was evidence of a prior end-to-end                            ileo-colonic anastomosis in the cecum. This was                            patent and was characterized by healthy appearing  mucosa. The anastomosis could not be traversed due                            to its diameter. It is an unusual anastomosis                            (indication for which and exact type of surgery are                            unkown from available records), with the ICV                            oversewn and the ileal opening adjacent to it.                           A few diverticula were found in the left colon and                            right colon.                           The exam was otherwise without abnormality on                            direct and retroflexion views. Complications:            No immediate complications. Estimated Blood Loss:     Estimated blood loss: none. Impression:               - Patent end-to-end ileo-colonic anastomosis,                            characterized by healthy appearing mucosa.                           - Diverticulosis in the left colon and in  the right                            colon.                           - The examination was otherwise normal on direct                            and retroflexion views.                           - No specimens collected. Recommendation:           - Patient has a contact number available for                            emergencies. The signs and symptoms of potential                            delayed complications were discussed with the  patient. Return to normal activities tomorrow.                            Written discharge instructions were provided to the                            patient.                           - Resume previous diet.                           - Continue present medications.                           - Return to referring physician.                           - See the other procedure note for documentation of                            additional recommendations. Jafeth Mustin L. Loletha Carrow, MD 06/13/2018 2:44:12 PM This report has been signed electronically.

## 2018-06-13 NOTE — Telephone Encounter (Signed)
See the following phone note.Marland KitchenMarland Kitchen

## 2018-06-13 NOTE — Patient Instructions (Signed)
YOU HAD AN ENDOSCOPIC PROCEDURE TODAY AT [facility]:   Refer to the procedure report that was given to you for any specific questions about what was found during the examination.  If the procedure report does not answer your questions, please call your gastroenterologist to clarify.  If you requested that your care partner not be given the details of your procedure findings, then the procedure report has been included in a sealed envelope for you to review at your convenience later.  YOU SHOULD EXPECT: Some feelings of bloating in the abdomen. Passage of more gas than usual.  Walking can help get rid of the air that was put into your GI tract during the procedure and reduce the bloating. If you had a lower endoscopy (such as a colonoscopy or flexible sigmoidoscopy) you may notice spotting of blood in your stool or on the toilet paper. If you underwent a bowel prep for your procedure, you may not have a normal bowel movement for a few days.  Please Note:  You might notice some irritation and congestion in your nose or some drainage.  This is from the oxygen used during your procedure.  There is no need for concern and it should clear up in a day or so.  SYMPTOMS TO REPORT IMMEDIATELY:   Following lower endoscopy (colonoscopy or flexible sigmoidoscopy):  Excessive amounts of blood in the stool  Significant tenderness or worsening of abdominal pains  Swelling of the abdomen that is new, acute  Fever of 100F or higher   Following upper endoscopy (EGD)  Vomiting of blood or coffee ground material  New chest pain or pain under the shoulder blades  Painful or persistently difficult swallowing  New shortness of breath  Fever of 100F or higher  Black, tarry-looking stools  For urgent or emergent issues, a gastroenterologist can be reached at any hour by calling [phone].   DIET:  We do recommend a small meal at first, but then you may proceed to your regular diet.  Drink  plenty of fluids but you should avoid alcoholic beverages for 24 hours. Try to increase the fiber in your diet, and drink plenty of water.  ACTIVITY:  You should plan to take it easy for the rest of today and you should NOT DRIVE or use heavy machinery until tomorrow (because of the sedation medicines used during the test).    FOLLOW UP: Our staff will call the number listed on your records the next business day following your procedure to check on you and address any questions or concerns that you may have regarding the information given to you following your procedure. If we do not reach you, we will leave a message.  However, if you are feeling well and you are not experiencing any problems, there is no need to return our call.  We will assume that you have returned to your regular daily activities without incident.   SIGNATURES/CONFIDENTIALITY: You and/or your care partner have signed paperwork which will be entered into your electronic medical record.  These signatures attest to the fact that that the information above on your After Visit Summary has been reviewed and is understood.  Full responsibility of the confidentiality of this discharge information lies with you and/or your care-partner.  Read all handouts given to you by your recovery room nurse.

## 2018-06-14 ENCOUNTER — Telehealth: Payer: Self-pay

## 2018-06-14 NOTE — Telephone Encounter (Signed)
  Follow up Call-  Call back number 06/13/2018  Post procedure Call Back phone  # [phone]-Mel[place] cell (daughter)  Permission to leave phone message Yes  Some recent data might be hidden     Patient questions:  Do you have a fever, pain , or abdominal swelling? No. Pain Score  0 *  Have you tolerated food without any problems? Yes.    Have you been able to return to your normal activities? Yes.    Do you have any questions about your discharge instructions: Diet   No. Medications  No. Follow up visit  No.  Do you have questions or concerns about your Care? No.  Actions: * If pain score is 4 or above: No action needed, pain <4.

## 2020-03-06 DIAGNOSIS — M170 Bilateral primary osteoarthritis of knee: Secondary | ICD-10-CM | POA: Diagnosis not present

## 2020-03-06 DIAGNOSIS — W19XXXA Unspecified fall, initial encounter: Secondary | ICD-10-CM | POA: Diagnosis not present

## 2020-03-06 DIAGNOSIS — S8002XA Contusion of left knee, initial encounter: Secondary | ICD-10-CM | POA: Diagnosis not present

## 2020-03-06 DIAGNOSIS — M25562 Pain in left knee: Secondary | ICD-10-CM | POA: Diagnosis not present

## 2020-03-06 DIAGNOSIS — I10 Essential (primary) hypertension: Secondary | ICD-10-CM | POA: Diagnosis not present

## 2020-03-06 DIAGNOSIS — S8001XA Contusion of right knee, initial encounter: Secondary | ICD-10-CM | POA: Diagnosis not present

## 2020-03-06 DIAGNOSIS — M25461 Effusion, right knee: Secondary | ICD-10-CM | POA: Diagnosis not present

## 2020-03-06 DIAGNOSIS — Z8546 Personal history of malignant neoplasm of prostate: Secondary | ICD-10-CM | POA: Diagnosis not present

## 2021-03-12 ENCOUNTER — Other Ambulatory Visit: Payer: Self-pay

## 2021-03-12 ENCOUNTER — Emergency Department (HOSPITAL_COMMUNITY)
Admission: EM | Admit: 2021-03-12 | Discharge: 2021-03-13 | Disposition: A | Discharge status: Home / Self Care | Payer: No Typology Code available for payment source | Attending: Emergency Medicine | Admitting: Emergency Medicine

## 2021-03-12 ENCOUNTER — Encounter (HOSPITAL_COMMUNITY): Payer: Self-pay

## 2021-03-12 DIAGNOSIS — L03116 Cellulitis of left lower limb: Secondary | ICD-10-CM | POA: Diagnosis not present

## 2021-03-12 DIAGNOSIS — Z7982 Long term (current) use of aspirin: Secondary | ICD-10-CM | POA: Insufficient documentation

## 2021-03-12 DIAGNOSIS — Z79899 Other long term (current) drug therapy: Secondary | ICD-10-CM | POA: Diagnosis not present

## 2021-03-12 DIAGNOSIS — I10 Essential (primary) hypertension: Secondary | ICD-10-CM | POA: Diagnosis not present

## 2021-03-12 LAB — CBC WITH DIFFERENTIAL/PLATELET
Abs Immature Granulocytes: 0.06 10*3/uL (ref 0.00–0.07)
Basophils Absolute: 0.1 10*3/uL (ref 0.0–0.1)
Basophils Relative: 1 %
Eosinophils Absolute: 0.2 10*3/uL (ref 0.0–0.5)
Eosinophils Relative: 2 %
HCT: 40.1 % (ref 39.0–52.0)
Hemoglobin: 13.1 g/dL (ref 13.0–17.0)
Immature Granulocytes: 1 %
Lymphocytes Relative: 19 %
Lymphs Abs: 1.7 10*3/uL (ref 0.7–4.0)
MCH: 27.6 pg (ref 26.0–34.0)
MCHC: 32.7 g/dL (ref 30.0–36.0)
MCV: 84.6 fL (ref 80.0–100.0)
Monocytes Absolute: 0.9 10*3/uL (ref 0.1–1.0)
Monocytes Relative: 10 %
Neutro Abs: 5.8 10*3/uL (ref 1.7–7.7)
Neutrophils Relative %: 67 %
Platelets: 286 10*3/uL (ref 150–400)
RBC: 4.74 MIL/uL (ref 4.22–5.81)
RDW: 14.2 % (ref 11.5–15.5)
WBC: 8.6 10*3/uL (ref 4.0–10.5)
nRBC: 0 % (ref 0.0–0.2)

## 2021-03-12 LAB — BASIC METABOLIC PANEL
Anion gap: 10 (ref 5–15)
BUN: 16 mg/dL (ref 8–23)
CO2: 23 mmol/L (ref 22–32)
Calcium: 9.1 mg/dL (ref 8.9–10.3)
Chloride: 101 mmol/L (ref 98–111)
Creatinine, Ser: 0.99 mg/dL (ref 0.61–1.24)
GFR, Estimated: >60 mL/min (ref >60)
Glucose, Bld: 118 mg/dL — ABNORMAL HIGH (ref 70–99)
Potassium: 4 mmol/L (ref 3.5–5.1)
Sodium: 134 mmol/L — ABNORMAL LOW (ref 135–145)

## 2021-03-12 NOTE — ED Provider Notes (Signed)
Emergency Medicine Provider Triage Evaluation Note  Gregory Ewing , a 76 y.o. male  was evaluated in triage.  Pt complains of cellulitis to left lower extremity.  He has been seen by the [facility] however they feel as though he needs IV antibiotics.  Has been taking clindamycin which helped clear out the area however it is again began to worsen.  Review of Systems  Positive:  Negative: Fevers, chills  Physical Exam  BP (!) 145/86 (BP Location: Left Arm)   Pulse 69   Temp 97.9 F (36.6 C) (Oral)   Resp 18   SpO2 99%  Gen:   Awake, no distress   Resp:  Normal effort  MSK:   Moves extremities without difficulty  Other:  Weeping sore to anterior and medial areas of the lower extremity.  2+ pitting edema  Medical Decision Making  Medically screening exam initiated at 4:40 PM.  Appropriate orders placed.  Gregory Ewing was informed that the remainder of the evaluation will be completed by another provider, this initial triage assessment does not replace that evaluation, and the importance of remaining in the ED until their evaluation is complete.     Rhae Hammock, PA-C 03/12/21 1654    Drenda Freeze, MD 03/12/21 [phone]

## 2021-03-12 NOTE — ED Triage Notes (Signed)
Pt reports being sent here for IV antibiotics for cellulitis of his left leg. He reports being on oral antibiotics for a few weeks and the pain, redness, and swelling subsided, but is starting to worse again.

## 2021-03-13 ENCOUNTER — Emergency Department (HOSPITAL_COMMUNITY): Payer: No Typology Code available for payment source

## 2021-03-13 LAB — LACTIC ACID, PLASMA: Lactic Acid, Venous: 1.4 mmol/L (ref 0.5–1.9)

## 2021-03-13 MED ORDER — SODIUM CHLORIDE 0.9 % IV SOLN
2.0000 g | Freq: Once | INTRAVENOUS | Status: AC
  Administered 2021-03-13: 2 g via INTRAVENOUS
  Filled 2021-03-13: qty 20

## 2021-03-13 MED ORDER — DOXYCYCLINE HYCLATE 100 MG PO CAPS: 100.0000 mg | ORAL_CAPSULE | Freq: Two times a day (BID) | ORAL | 0 refills | Status: DC

## 2021-03-13 MED ORDER — DOXYCYCLINE HYCLATE 100 MG PO TABS
100.0000 mg | ORAL_TABLET | Freq: Once | ORAL | Status: AC
  Administered 2021-03-13: 100 mg via ORAL
  Filled 2021-03-13: qty 1

## 2021-03-13 MED ORDER — CEFDINIR 300 MG PO CAPS: 300.0000 mg | ORAL_CAPSULE | Freq: Two times a day (BID) | ORAL | 0 refills | Status: DC

## 2021-03-13 NOTE — ED Provider Notes (Signed)
[name] DEPT Provider Note   CSN: [id number] Arrival date & time: 03/12/21  1615     History Chief Complaint  Patient presents with   Cellulitis    Gregory Ewing is a 75 y.o. male.  Patient presents to the emergency department for evaluation of infection of his left leg.  Patient reports that he had a cellulitis that began on October 2.  He was seen in the ER at [facility] and started on clindamycin.  He reports that after a few days of being on the antibiotics, the redness and swelling resolved.  Over the last 3 days the redness, swelling and drainage has returned.  Patient reports that [facility] come to [facility] for [facility] antibiotics.  He has not been on antibiotics since the recurrence of the symptoms.      Past Medical History:  Diagnosis Date   Arthritis    Astigmatism    Family history of breast cancer    Hyperlipidemia    Hypertension    Leukocytosis    LFTs abnormal    Obstructive sleep apnea    Presbyopia    Prostate cancer [facility])     Patient Active Problem List   Diagnosis Date Noted   Obstructive sleep apnea 10/24/2017   Chronic cough 09/10/2017   Irritable larynx 09/10/2017   Genetic testing 08/31/2017   Family history of breast cancer    Malignant neoplasm of prostate ([place]) 12/22/2016   Leukocytosis    LFTs abnormal     Past Surgical History:  Procedure Laterality Date   APPENDECTOMY     LUMBAR DISC SURGERY     partial small bowel resection     TONSILLECTOMY         Family History  Problem Relation Age of Onset   Breast cancer Mother 104   Diabetes Daughter        d. 75   Heart attack Paternal Uncle    Heart attack Maternal Grandmother    Heart attack Maternal Grandfather    Heart attack Paternal Grandmother    Heart attack Paternal Grandfather    Throat cancer Brother        smoker and drinker   Heart attack Brother    Esophageal cancer Brother    Cervical cancer Daughter    Asthma Granddaughter     Colon cancer Neg Hx    Pancreatic cancer Neg Hx    Rectal cancer Neg Hx    Stomach cancer Neg Hx     Social History   Tobacco Use   Smoking status: Never   Smokeless tobacco: Never  Vaping Use   Vaping Use: Never used  Substance Use Topics   Alcohol use: No   Drug use: No    Home Medications Prior to Admission medications   Medication Sig Start Date End Date Taking? Authorizing Provider  cefdinir (OMNICEF) 300 MG capsule Take 1 capsule (300 mg total) by mouth 2 (two) times daily. 03/13/21  Yes Babette Stum, Gwenyth Allegra, MD  doxycycline (VIBRAMYCIN) 100 MG capsule Take 1 capsule (100 mg total) by mouth 2 (two) times daily. 03/13/21  Yes Whitley Strycharz, Gwenyth Allegra, MD  acetaminophen (TYLENOL) 500 MG tablet Take 1,000 mg by mouth 4 (four) times daily.    [provider]  aspirin 81 MG tablet Take 81 mg by mouth daily.    [provider]  Calcium Carb-Cholecalciferol (CALCIUM/VITAMIN D) 600-400 MG-UNIT TABS Take by mouth.    [provider]  cetirizine (ZYRTEC) 10 MG tablet Take 10 mg by mouth daily.    [provider]  citalopram (CELEXA) 40 MG tablet Take 40 mg by mouth daily. 1/2 tab daily    [provider]  diphenoxylate-atropine (LOMOTIL) 2.5-0.025 MG tablet Take 1-2 tablets by mouth 4 (four) times daily as needed for diarrhea or loose stools. 06/02/17   Bruning, Ashlyn, PA-C  ferrous sulfate 325 (65 FE) MG tablet Take 325 mg by mouth daily with breakfast.    [provider]  fluticasone (FLONASE) 50 MCG/ACT nasal spray Place 2 sprays into both nostrils daily.    [provider]  Leuprolide Acetate, 3 Month, (LUPRON DEPOT, 34-MONTH, IM) Inject into the muscle every 3 (three) months.    [provider]  lovastatin (MEVACOR) 20 MG tablet Take 20 mg by mouth at bedtime.    [provider]  mometasone (ASMANEX 60 METERED DOSES) 220 MCG/INH inhaler Inhale 2 puffs into the lungs daily. 09/10/17   Brand Males,  MD  NON FORMULARY B12 injection once a month    [provider]  NON FORMULARY Immune support OTC vitamin c 1000 mg daily    [provider]  tamsulosin (FLOMAX) 0.4 MG CAPS capsule Take 0.8 mg by mouth.     [provider]  VENTOLIN HFA 108 [phone] Base) MCG/ACT inhaler  06/16/17   [provider]    Allergies    Patient has no known allergies.  Review of Systems   Review of Systems  Constitutional:  Negative for fever.  Skin:  Positive for color change and wound.  All other systems reviewed and are negative.  Physical Exam Updated Vital Signs BP (!) 178/64 (BP Location: Left Arm)   Pulse 92   Temp 98.8 F (37.1 C) (Oral)   Resp 16   SpO2 100%   Physical Exam Vitals and nursing note reviewed.  Constitutional:      General: He is not in acute distress.    Appearance: Normal appearance. He is well-developed.  HENT:     Head: Normocephalic and atraumatic.     Right Ear: Hearing normal.     Left Ear: Hearing normal.     Nose: Nose normal.  Eyes:     Conjunctiva/sclera: Conjunctivae normal.     Pupils: Pupils are equal, round, and reactive to light.  Cardiovascular:     Rate and Rhythm: Regular rhythm.     Heart sounds: S1 normal and S2 normal. No murmur heard.   No friction rub. No gallop.  Pulmonary:     Effort: Pulmonary effort is normal. No respiratory distress.     Breath sounds: Normal breath sounds.  Chest:     Chest wall: No tenderness.  Abdominal:     General: Bowel sounds are normal.     Palpations: Abdomen is soft.     Tenderness: There is no abdominal tenderness. There is no guarding or rebound. Negative signs include Murphy's sign and McBurney's sign.     Hernia: No hernia is present.  Musculoskeletal:        General: Normal range of motion.     Cervical back: Normal range of motion and neck supple.  Skin:    General: Skin is warm and dry.     Findings: Erythema (LLE) and wound (posterior LLL, anterior LLL) present. No  rash.  Neurological:     Mental Status: He is alert and oriented to person, place, and time.     GCS: GCS eye subscore  is 4. GCS verbal subscore is 5. GCS motor subscore is 6.     Cranial Nerves: No cranial nerve deficit.     Sensory: No sensory deficit.     Coordination: Coordination normal.  Psychiatric:        Speech: Speech normal.        Behavior: Behavior normal.        Thought Content: Thought content normal.    ED Results / Procedures / Treatments   Labs (all labs ordered are listed, but only abnormal results are displayed) Labs Reviewed  BASIC METABOLIC PANEL - Abnormal; Notable for the following components:      Result Value   Sodium 134 (*)    Glucose, Bld 118 (*)    All other components within normal limits  CBC WITH DIFFERENTIAL/PLATELET  LACTIC ACID, PLASMA    EKG None  Radiology DG Tibia/Fibula Left  Result Date: 03/13/2021 CLINICAL DATA:  Cellulitis. EXAM: LEFT TIBIA AND FIBULA - 2 VIEW COMPARISON:  None. FINDINGS: There is no acute fracture or dislocation. There is arthritic changes of the left knee with tricompartmental narrowing and spurring. There is severe narrowing of the medial compartment. Mild diffuse subcutaneous edema. No radiopaque foreign object or soft tissue gas. IMPRESSION: 1. No acute fracture or dislocation. 2. Osteoarthritis of the left knee. 3. Diffuse subcutaneous edema. Electronically Signed   By: Anner Crete M.D.   On: 03/13/2021 01:25    Procedures Procedures   Medications Ordered in ED Medications  cefTRIAXone (ROCEPHIN) 2 g in sodium chloride 0.9 % 100 mL IVPB (has no administration in time range)    ED Course  I have reviewed the triage vital signs and the nursing notes.  Pertinent labs & imaging results that were available during my care of the patient were reviewed by me and considered in my medical decision making (see chart for details).    MDM Rules/Calculators/A&P                           Patient presents to  the emergency department for evaluation of recurrent cellulitis of the left lower extremity.  Patient had an episode 1 month ago and was treated with clindamycin.  Patient reports resolution of the redness and swelling but in the last 3 days symptoms have recurred again.  He has some skin breakdown on the anterior and posterior aspect of the leg.  There is associated mild erythema and warmth.  Some serous drainage is noted.  He has some swelling diffusely of the leg.  Records reviewed from previous visit at [facility] indicate that he did have a DVT study at that time was negative.  I do not feel that he requires a repeat test.  He is well-appearing, afebrile with normal white blood cell count and normal lactic acid.  Does not require hospitalization at this time.  Will treat with Rocephin and doxy, close follow-up.  Final Clinical Impression(s) / ED Diagnoses Final diagnoses:  Cellulitis of left lower extremity    Rx / DC Orders ED Discharge Orders          Ordered    cefdinir (OMNICEF) 300 MG capsule  2 times daily        03/13/21 0235    doxycycline (VIBRAMYCIN) 100 MG capsule  2 times daily        03/13/21 0235             Orpah Greek, MD 03/13/21 [phone]

## 2022-03-12 ENCOUNTER — Other Ambulatory Visit (HOSPITAL_COMMUNITY): Payer: Self-pay | Admitting: Internal Medicine

## 2022-03-12 DIAGNOSIS — R609 Edema, unspecified: Secondary | ICD-10-CM

## 2022-03-13 ENCOUNTER — Ambulatory Visit (HOSPITAL_COMMUNITY)
Admission: RE | Admit: 2022-03-13 | Discharge: 2022-03-13 | Disposition: A | Discharge status: Home / Self Care | Payer: No Typology Code available for payment source | Source: Ambulatory Visit | Attending: Vascular Surgery | Admitting: Vascular Surgery

## 2022-03-13 DIAGNOSIS — R609 Edema, unspecified: Secondary | ICD-10-CM | POA: Diagnosis present

## 2022-05-13 ENCOUNTER — Encounter: Payer: Self-pay | Admitting: Internal Medicine

## 2022-05-13 ENCOUNTER — Ambulatory Visit: Payer: Medicare HMO | Admitting: Internal Medicine

## 2022-05-13 VITALS — BP 167/80 | HR 73 | Ht 64.0 in | Wt 186.2 lb

## 2022-05-13 DIAGNOSIS — E782 Mixed hyperlipidemia: Secondary | ICD-10-CM

## 2022-05-13 DIAGNOSIS — I739 Peripheral vascular disease, unspecified: Secondary | ICD-10-CM

## 2022-05-13 DIAGNOSIS — R0609 Other forms of dyspnea: Secondary | ICD-10-CM

## 2022-05-13 DIAGNOSIS — R609 Edema, unspecified: Secondary | ICD-10-CM

## 2022-05-13 DIAGNOSIS — I10 Essential (primary) hypertension: Secondary | ICD-10-CM

## 2022-05-13 MED ORDER — METOPROLOL SUCCINATE ER 50 MG PO TB24: 50.0000 mg | ORAL_TABLET | Freq: Every day | ORAL | 2 refills | Status: DC

## 2022-05-13 NOTE — Progress Notes (Unsigned)
Primary Physician/Referring:  Delorise Shiner, MD  Patient ID: Gregory Ewing, male    DOB: [birth year], 77 y.o.   MRN: [id number]  Chief Complaint  Patient presents with   Edema   New Patient (Initial Visit)   HPI:    Doug Bucklin Ewing  is a 77 y.o. male with past medical history significant for hypertension, hyperlipidemia, and venous insufficiency who is here to establish care with cardiology.  Patient follows regularly with the [facility] and he has been seen many times for recurrent left lower extremity cellulitis.  There was concern for DVT and he had a lower extremity venous duplex which did not show any DVT however there was venous reflux noted in the left greater saphenous vein.  Patient did just see a provider at the vein clinic but he says that no procedure has been scheduled.  He complains claudication and cold feet and toes.  Additionally patient admits to dyspnea on exertion and worsening edema in his lower extremities.  His left leg is generally more swollen than his right but lately he has noticed worsening swelling in both legs.  Patient denies chest pain palpitations, diaphoresis, PND.  Past Medical History:  Diagnosis Date   Arthritis    Astigmatism    Family history of breast cancer    Hyperlipidemia    Hypertension    Leukocytosis    LFTs abnormal    Obstructive sleep apnea    Presbyopia    Prostate cancer ([place])    Past Surgical History:  Procedure Laterality Date   APPENDECTOMY     LUMBAR DISC SURGERY     partial small bowel resection     TONSILLECTOMY     Family History  Problem Relation Age of Onset   Breast cancer Mother 66   Diabetes Daughter        d. 35   Heart attack Paternal Uncle    Heart attack Maternal Grandmother    Heart attack Maternal Grandfather    Heart attack Paternal Grandmother    Heart attack Paternal Grandfather    Throat cancer Brother        smoker and drinker   Heart attack Brother    Esophageal cancer Brother    Cervical cancer  Daughter    Asthma Granddaughter    Colon cancer Neg Hx    Pancreatic cancer Neg Hx    Rectal cancer Neg Hx    Stomach cancer Neg Hx     Social History   Tobacco Use   Smoking status: Never   Smokeless tobacco: Never  Substance Use Topics   Alcohol use: No   Marital Status: Single  ROS  Review of Systems  Cardiovascular:  Positive for claudication, dyspnea on exertion and leg swelling.   Objective  Blood pressure (!) 167/80, pulse 73, height '5\' 4"'$  (1.626 m), weight 186 lb 3.2 oz (84.5 kg), SpO2 99 %. Body mass index is 31.96 kg/m.     05/13/2022    9:20 AM 05/13/2022    9:09 AM 03/13/2021    3:30 AM  Vitals with BMI  Height  '5\' 4"'$    Weight  186 lbs 3 oz   BMI  30.16   Systolic 010 932 355  Diastolic 80 74 76  Pulse 73 73 81     Physical Exam Vitals reviewed.  HENT:     Head: Normocephalic and atraumatic.  Cardiovascular:     Rate and Rhythm: Normal rate and regular rhythm.     Pulses:  Decreased pulses.          Dorsalis pedis pulses are 1+ on the right side and 1+ on the left side.       Posterior tibial pulses are 1+ on the right side and 1+ on the left side.     Heart sounds: Normal heart sounds. No murmur heard. Pulmonary:     Effort: Pulmonary effort is normal.     Breath sounds: Normal breath sounds.  Abdominal:     General: Bowel sounds are normal.  Musculoskeletal:     Right lower leg: Edema present.     Left lower leg: Edema present.  Skin:    General: Skin is warm and dry.  Neurological:     Mental Status: He is alert.     Medications and allergies  No Known Allergies   Medication list after today's encounter   Current Outpatient Medications:    acetaminophen (TYLENOL) 500 MG tablet, Take 1,000 mg by mouth 4 (four) times daily., Disp: , Rfl:    Calcium Carb-Cholecalciferol (CALCIUM/VITAMIN D) 600-400 MG-UNIT TABS, Take by mouth., Disp: , Rfl:    cetirizine (ZYRTEC) 10 MG tablet, Take 10 mg by mouth daily., Disp: , Rfl:    citalopram (CELEXA)  40 MG tablet, Take 40 mg by mouth daily. 1/2 tab daily, Disp: , Rfl:    Cyanocobalamin (B-12 COMPLIANCE INJECTION IJ), Inject as directed., Disp: , Rfl:    ferrous sulfate 325 (65 FE) MG tablet, Take 325 mg by mouth daily with breakfast., Disp: , Rfl:    fluticasone (FLONASE) 50 MCG/ACT nasal spray, Place 2 sprays into both nostrils daily., Disp: , Rfl:    furosemide (LASIX) 20 MG tablet, Take 20 mg by mouth daily., Disp: , Rfl:    gabapentin (NEURONTIN) 300 MG capsule, Take 1 capsule by mouth at bedtime., Disp: , Rfl:    loperamide (IMODIUM) 2 MG capsule, Take 2 mg by mouth as needed., Disp: , Rfl:    losartan (COZAAR) 100 MG tablet, Take 100 mg by mouth daily., Disp: , Rfl:    lovastatin (MEVACOR) 20 MG tablet, Take 20 mg by mouth at bedtime., Disp: , Rfl:    metoprolol succinate (TOPROL-XL) 50 MG 24 hr tablet, Take 1 tablet (50 mg total) by mouth daily. Take with or immediately following a meal., Disp: 90 tablet, Rfl: 2   mometasone (ASMANEX 60 METERED DOSES) 220 MCG/INH inhaler, Inhale 2 puffs into the lungs daily., Disp: 2 Inhaler, Rfl: 0   tamsulosin (FLOMAX) 0.4 MG CAPS capsule, Take 0.8 mg by mouth. , Disp: , Rfl:    VENTOLIN HFA 108 (90 Base) MCG/ACT inhaler, , Disp: , Rfl:    Leuprolide Acetate, 3 Month, (LUPRON DEPOT, 30-MONTH, IM), Inject into the muscle every 3 (three) months., Disp: , Rfl:    NON FORMULARY, Immune support OTC vitamin c 1000 mg daily, Disp: , Rfl:   Laboratory examination:   Lab Results  Component Value Date   NA 134 (L) 03/12/2021   K 4.0 03/12/2021   CO2 23 03/12/2021   GLUCOSE 118 (H) 03/12/2021   BUN 16 03/12/2021   CREATININE 0.99 03/12/2021   CALCIUM 9.1 03/12/2021   GFRNONAA >60 03/12/2021       Latest Ref Rng & Units 03/12/2021    5:17 PM 11/19/2011   10:49 AM  CMP  Glucose 70 - 99 mg/dL 118  106   BUN 8 - 23 mg/dL 16  16   Creatinine 0.61 - 1.24 mg/dL 0.99  1.29  Sodium 135 - 145 mmol/L 134  135   Potassium 3.5 - 5.1 mmol/L 4.0  4.7    Chloride 98 - 111 mmol/L 101  103   CO2 22 - 32 mmol/L 23  24   Calcium 8.9 - 10.3 mg/dL 9.1  9.0   Total Protein 6.0 - 8.3 g/dL  6.7   Total Bilirubin 0.3 - 1.2 mg/dL  0.4   Alkaline Phos 39 - 117 U/L  49   AST 0 - 37 U/L  25   ALT 0 - 53 U/L  32       Latest Ref Rng & Units 03/12/2021    5:17 PM 09/10/2017   12:49 PM 11/19/2011   10:49 AM  CBC  WBC 4.0 - 10.5 K/uL 8.6  6.2  8.8   Hemoglobin 13.0 - 17.0 g/dL 13.1  12.5  12.8   Hematocrit 39.0 - 52.0 % 40.1  38.0  38.6   Platelets 150 - 400 K/uL 286  281.0  322     Lipid Panel No results for input(s): "CHOL", "TRIG", "LDLCALC", "VLDL", "HDL", "CHOLHDL", "LDLDIRECT" in the last 8760 hours.  HEMOGLOBIN A1C No results found for: "HGBA1C", "MPG" TSH No results for input(s): "TSH" in the last 8760 hours.  External labs:     Radiology:    Cardiac Studies:   03/2022 Venous Duplex Summary:  Left:  - No evidence of deep vein thrombosis seen in the left lower extremity,  from the common femoral through the popliteal veins.  - No evidence of superficial venous reflux seen in the left short  saphenous vein.  - Venous reflux is noted in the left greater saphenous vein in the  proximal calf   EKG:   05/13/2022: Sinus Rhythm, normal axis. No ischemia.  Assessment     ICD-10-CM   1. Edema, unspecified type  R60.9 EKG 12-Lead    PCV ECHOCARDIOGRAM COMPLETE    PCV LOWER ARTERIAL (BILATERAL)    2. Essential hypertension  I10 PCV ECHOCARDIOGRAM COMPLETE    PCV LOWER ARTERIAL (BILATERAL)    3. Mixed hyperlipidemia  E78.2 PCV ECHOCARDIOGRAM COMPLETE    PCV LOWER ARTERIAL (BILATERAL)    4. DOE (dyspnea on exertion)  R06.09 PCV ECHOCARDIOGRAM COMPLETE    PCV LOWER ARTERIAL (BILATERAL)    5. Claudication in peripheral vascular disease (HCC)  I73.9 PCV LOWER ARTERIAL (BILATERAL)       Orders Placed This Encounter  Procedures   EKG 12-Lead   PCV ECHOCARDIOGRAM COMPLETE    Standing Status:   Future    Standing Expiration  Date:   05/14/2023    Meds ordered this encounter  Medications   metoprolol succinate (TOPROL-XL) 50 MG 24 hr tablet    Sig: Take 1 tablet (50 mg total) by mouth daily. Take with or immediately following a meal.    Dispense:  90 tablet    Refill:  2    Medications Discontinued During This Encounter  Medication Reason   aspirin 81 MG tablet Completed Course   cefdinir (OMNICEF) 300 MG capsule Completed Course   doxycycline (VIBRAMYCIN) 100 MG capsule Completed Course   diphenoxylate-atropine (LOMOTIL) 5.6-3.875 MG tablet Duplicate   NON FORMULARY Completed Course     Recommendations:   Huxley Shurley is a 77 y.o.  male with LE edema and HTN  Edema, unspecified type Patient is on Lasix 20 mg qDay He has known venous insufficiency but unclear if there is heart failure component here Echocardiogram has been ordered  Essential hypertension Continue current cardiac medications. BP uncontrolled, will add Toprol 50 mg Encourage low-sodium diet, less than 2000 mg daily. Follow-up in 1-2 months or sooner if needed.   Mixed hyperlipidemia Continue statin [facility]   DOE (dyspnea on exertion) Echocardiogram ordered   Claudication in peripheral vascular disease ([place]) Bilateral LE arterial u/s ordered Patient has known venous insufficiency with venous reflux and recurring cellulitis however he has not had his arteries evaluated and he has decreased pulses in both LE in addition to symptoms of claudication     Floydene Flock, DO, [facility]  05/14/2022, 1:20 PM Office: [phone] Pager: [phone]

## 2022-05-21 ENCOUNTER — Ambulatory Visit: Payer: No Typology Code available for payment source

## 2022-05-21 DIAGNOSIS — I10 Essential (primary) hypertension: Secondary | ICD-10-CM

## 2022-05-21 DIAGNOSIS — I739 Peripheral vascular disease, unspecified: Secondary | ICD-10-CM

## 2022-05-21 DIAGNOSIS — R609 Edema, unspecified: Secondary | ICD-10-CM

## 2022-05-21 DIAGNOSIS — R0609 Other forms of dyspnea: Secondary | ICD-10-CM

## 2022-05-21 DIAGNOSIS — E782 Mixed hyperlipidemia: Secondary | ICD-10-CM

## 2022-05-24 NOTE — Progress Notes (Signed)
normal

## 2022-05-25 NOTE — Progress Notes (Signed)
LMTCB

## 2022-05-25 NOTE — Progress Notes (Signed)
Gave results to patient. He verbalized understanding. 

## 2022-06-24 ENCOUNTER — Ambulatory Visit: Payer: No Typology Code available for payment source | Admitting: Internal Medicine

## 2022-06-24 ENCOUNTER — Encounter: Payer: Self-pay | Admitting: Internal Medicine

## 2022-06-24 VITALS — BP 142/66 | HR 79 | Ht 64.0 in | Wt 187.6 lb

## 2022-06-24 DIAGNOSIS — I10 Essential (primary) hypertension: Secondary | ICD-10-CM

## 2022-06-24 DIAGNOSIS — I872 Venous insufficiency (chronic) (peripheral): Secondary | ICD-10-CM

## 2022-06-24 DIAGNOSIS — E782 Mixed hyperlipidemia: Secondary | ICD-10-CM

## 2022-06-24 NOTE — Progress Notes (Signed)
Primary Physician/Referring:  Delorise Shiner, MD  Patient ID: Gregory Ewing, male    DOB: [birth year], 77 y.o.   MRN: [id number]  Chief Complaint  Patient presents with   Edema   Follow-up   Results   HPI:    Gregory Ewing  is a 77 y.o. male with past medical history significant for hypertension, hyperlipidemia, and venous insufficiency who is here for a follow-up visit. He has been doing well since the last time he was here. No concerns or complaints today. He is tolerating his medications without issues.  Patient denies chest pain palpitations, diaphoresis, PND.  Past Medical History:  Diagnosis Date   Arthritis    Astigmatism    Family history of breast cancer    Hyperlipidemia    Hypertension    Leukocytosis    LFTs abnormal    Obstructive sleep apnea    Presbyopia    Prostate cancer ([place])    Past Surgical History:  Procedure Laterality Date   APPENDECTOMY     LUMBAR DISC SURGERY     partial small bowel resection     TONSILLECTOMY     Family History  Problem Relation Age of Onset   Breast cancer Mother 75   Diabetes Daughter        d. 106   Heart attack Paternal Uncle    Heart attack Maternal Grandmother    Heart attack Maternal Grandfather    Heart attack Paternal Grandmother    Heart attack Paternal Grandfather    Throat cancer Brother        smoker and drinker   Heart attack Brother    Esophageal cancer Brother    Cervical cancer Daughter    Asthma Granddaughter    Colon cancer Neg Hx    Pancreatic cancer Neg Hx    Rectal cancer Neg Hx    Stomach cancer Neg Hx     Social History   Tobacco Use   Smoking status: Never   Smokeless tobacco: Never  Substance Use Topics   Alcohol use: No   Marital Status: Single  ROS  Review of Systems  Cardiovascular:  Positive for leg swelling. Negative for claudication and dyspnea on exertion.   Objective  Blood pressure (!) 142/66, pulse 79, height 5\' 4"  (1.626 m), weight 187 lb 9.6 oz (85.1 kg), SpO2  96 %. Body mass index is 32.2 kg/m.     06/24/2022   10:21 AM 05/13/2022    9:20 AM 05/13/2022    9:09 AM  Vitals with BMI  Height 5\' 4"   5\' 4"   Weight 187 lbs 10 oz  186 lbs 3 oz  BMI [id number]  [id number]  Systolic [id number]  Diastolic 66 80 74  Pulse 79 73 73     Physical Exam Vitals reviewed.  HENT:     Head: Normocephalic and atraumatic.  Cardiovascular:     Rate and Rhythm: Normal rate and regular rhythm.     Pulses: Decreased pulses.          Dorsalis pedis pulses are 1+ on the right side and 1+ on the left side.       Posterior tibial pulses are 1+ on the right side and 1+ on the left side.     Heart sounds: Normal heart sounds. No murmur heard. Pulmonary:     Effort: Pulmonary effort is normal.     Breath sounds: Normal breath sounds.  Abdominal:     General: Bowel sounds are normal.  Musculoskeletal:     Right lower leg: Edema present.     Left lower leg: Edema present.  Skin:    General: Skin is warm and dry.  Neurological:     Mental Status: He is alert.     Medications and allergies  No Known Allergies   Medication list after today's encounter   Current Outpatient Medications:    Calcium Carb-Cholecalciferol (CALCIUM/VITAMIN D) 600-400 MG-UNIT TABS, Take by mouth., Disp: , Rfl:    cetirizine (ZYRTEC) 10 MG tablet, Take 10 mg by mouth daily., Disp: , Rfl:    citalopram (CELEXA) 40 MG tablet, Take 40 mg by mouth daily. 1/2 tab daily, Disp: , Rfl:    Cyanocobalamin (B-12 COMPLIANCE INJECTION IJ), Inject as directed., Disp: , Rfl:    ferrous sulfate 325 (65 FE) MG tablet, Take 325 mg by mouth daily with breakfast. mwf, Disp: , Rfl:    fluticasone (FLONASE) 50 MCG/ACT nasal spray, Place 2 sprays into both nostrils daily., Disp: , Rfl:    furosemide (LASIX) 20 MG tablet, Take 20 mg by mouth daily., Disp: , Rfl:    gabapentin (NEURONTIN) 300 MG capsule, Take 1 capsule by mouth at bedtime., Disp: , Rfl:    ibuprofen (ADVIL) 200 MG tablet, Take 200 mg by mouth every 6  (six) hours as needed., Disp: , Rfl:    loperamide (IMODIUM) 2 MG capsule, Take 2 mg by mouth as needed., Disp: , Rfl:    losartan (COZAAR) 100 MG tablet, Take 100 mg by mouth daily., Disp: , Rfl:    lovastatin (MEVACOR) 20 MG tablet, Take 20 mg by mouth at bedtime., Disp: , Rfl:    metoprolol succinate (TOPROL-XL) 50 MG 24 hr tablet, Take 1 tablet (50 mg total) by mouth daily. Take with or immediately following a meal., Disp: 90 tablet, Rfl: 2   mometasone (ASMANEX 60 METERED DOSES) 220 MCG/INH inhaler, Inhale 2 puffs into the lungs daily., Disp: 2 Inhaler, Rfl: 0   NON FORMULARY, Immune support OTC vitamin c 1000 mg daily, Disp: , Rfl:    tamsulosin (FLOMAX) 0.4 MG CAPS capsule, Take 0.8 mg by mouth. , Disp: , Rfl:    traMADol (ULTRAM) 50 MG tablet, Take 50 mg by mouth every 6 (six) hours as needed., Disp: , Rfl:    VENTOLIN HFA 108 (90 Base) MCG/ACT inhaler, , Disp: , Rfl:    acetaminophen (TYLENOL) 500 MG tablet, Take 1,000 mg by mouth 4 (four) times daily. (Patient not taking: Reported on 06/24/2022), Disp: , Rfl:    Leuprolide Acetate, 3 Month, (LUPRON DEPOT, 40-MONTH, IM), Inject into the muscle every 3 (three) months., Disp: , Rfl:   Laboratory examination:   Lab Results  Component Value Date   NA 134 (L) 03/12/2021   K 4.0 03/12/2021   CO2 23 03/12/2021   GLUCOSE 118 (H) 03/12/2021   BUN 16 03/12/2021   CREATININE 0.99 03/12/2021   CALCIUM 9.1 03/12/2021   GFRNONAA >60 03/12/2021       Latest Ref Rng & Units 03/12/2021    5:17 PM 11/19/2011   10:49 AM  CMP  Glucose 70 - 99 mg/dL 118  106   BUN 8 - 23 mg/dL 16  16   Creatinine 0.61 - 1.24 mg/dL 0.99  1.29   Sodium 135 - 145 mmol/L 134  135   Potassium 3.5 - 5.1 mmol/L 4.0  4.7   Chloride 98 - 111 mmol/L 101  103   CO2 22 - 32 mmol/L 23  24   Calcium 8.9 - 10.3 mg/dL 9.1  9.0   Total Protein 6.0 - 8.3 g/dL  6.7   Total Bilirubin 0.3 - 1.2 mg/dL  0.4   Alkaline Phos 39 - 117 U/L  49   AST 0 - 37 U/L  25   ALT 0 - 53 U/L   32       Latest Ref Rng & Units 03/12/2021    5:17 PM 09/10/2017   12:49 PM 11/19/2011   10:49 AM  CBC  WBC 4.0 - 10.5 K/uL 8.6  6.2  8.8   Hemoglobin 13.0 - 17.0 g/dL 13.1  12.5  12.8   Hematocrit 39.0 - 52.0 % 40.1  38.0  38.6   Platelets 150 - 400 K/uL 286  281.0  322     Lipid Panel No results for input(s): "CHOL", "TRIG", "LDLCALC", "VLDL", "HDL", "CHOLHDL", "LDLDIRECT" in the last 8760 hours.  HEMOGLOBIN A1C No results found for: "HGBA1C", "MPG" TSH No results for input(s): "TSH" in the last 8760 hours.  External labs:     Radiology:    Cardiac Studies:   03/2022 Venous Duplex Summary:  Left:  - No evidence of deep vein thrombosis seen in the left lower extremity,  from the common femoral through the popliteal veins.  - No evidence of superficial venous reflux seen in the left short  saphenous vein.  - Venous reflux is noted in the left greater saphenous vein in the  proximal calf    Lower Extremity Arterial Duplex 05/21/2022:  Moderate velocity increase at the right proximal posterior tibial artery  suggests >50% stenosis.  No hemodynamically significant stenoses are identified in the left lower  extremity arterial system.  This exam reveals mildly decreased perfusion of the right lower extremity,  noted at the dorsalis pedis and post tibial artery level (ABI 0.95) with  mildly abnormal biphasic waveform and dampened flow in the DP at the level  of the ankle.   This exam reveals normal perfusion of the left lower extremity (ABI 0.98)  with mildly abnormal biphasic waveform pattern at the left ankle.    TTE 05/21/2022: 1. Normal LV systolic function with visual EF 60-65%. Left ventricle cavity is normal in size. Normal left ventricular wall thickness. Normal global wall motion. Normal diastolic filling pattern, normal LAP. 2. Mild (Grade I) aortic regurgitation. 3. Mild tricuspid regurgitation. No evidence of pulmonary hypertension. 4. No prior study for  comparison.   EKG:   05/13/2022: Sinus Rhythm, normal axis. No ischemia.  Assessment     ICD-10-CM   1. Essential hypertension  I10     2. Mixed hyperlipidemia  E78.2     3. Chronic venous insufficiency  I87.2        No orders of the defined types were placed in this encounter.   No orders of the defined types were placed in this encounter.   There are no discontinued medications.    Recommendations:   Gregory Ewing is a 77 y.o.  male with HLD and HTN  Essential hypertension Continue current cardiac medications. BP better this visit, still slightly elevated Encourage low-sodium diet, less than 2000 mg daily. Follow-up in 6 months or sooner if needed.   Mixed hyperlipidemia Continue statin Managed by [facility]   Chronic venous insufficiency Bilateral LE arterial u/s negative for PAD Likely due to pseudoclaudication from [place], DO, [facility]  06/28/2022, 4:56 PM Office: [phone] Pager: [phone]

## 2022-08-02 ENCOUNTER — Encounter (HOSPITAL_COMMUNITY): Payer: Self-pay | Admitting: Internal Medicine

## 2022-08-02 ENCOUNTER — Ambulatory Visit
Admission: EM | Admit: 2022-08-02 | Discharge: 2022-08-02 | Disposition: A | Discharge status: Home / Self Care | Payer: No Typology Code available for payment source

## 2022-08-02 ENCOUNTER — Other Ambulatory Visit: Payer: Self-pay

## 2022-08-02 ENCOUNTER — Inpatient Hospital Stay (HOSPITAL_BASED_OUTPATIENT_CLINIC_OR_DEPARTMENT_OTHER)
Admission: EM | Admit: 2022-08-02 | Discharge: 2022-08-05 | DRG: 389 | Disposition: A | Discharge status: Home / Self Care | Payer: No Typology Code available for payment source | Attending: Internal Medicine | Admitting: Internal Medicine

## 2022-08-02 ENCOUNTER — Emergency Department (HOSPITAL_BASED_OUTPATIENT_CLINIC_OR_DEPARTMENT_OTHER): Payer: No Typology Code available for payment source

## 2022-08-02 ENCOUNTER — Ambulatory Visit
Admission: EM | Admit: 2022-08-02 | Discharge: 2022-08-02 | Discharge status: Against Medical Advice | Payer: No Typology Code available for payment source

## 2022-08-02 DIAGNOSIS — H524 Presbyopia: Secondary | ICD-10-CM | POA: Diagnosis present

## 2022-08-02 DIAGNOSIS — R051 Acute cough: Secondary | ICD-10-CM

## 2022-08-02 DIAGNOSIS — F32A Depression, unspecified: Secondary | ICD-10-CM | POA: Diagnosis present

## 2022-08-02 DIAGNOSIS — R109 Unspecified abdominal pain: Secondary | ICD-10-CM | POA: Diagnosis not present

## 2022-08-02 DIAGNOSIS — D509 Iron deficiency anemia, unspecified: Secondary | ICD-10-CM | POA: Diagnosis present

## 2022-08-02 DIAGNOSIS — Z8546 Personal history of malignant neoplasm of prostate: Secondary | ICD-10-CM

## 2022-08-02 DIAGNOSIS — Z9049 Acquired absence of other specified parts of digestive tract: Secondary | ICD-10-CM | POA: Diagnosis not present

## 2022-08-02 DIAGNOSIS — I10 Essential (primary) hypertension: Secondary | ICD-10-CM | POA: Diagnosis present

## 2022-08-02 DIAGNOSIS — G4733 Obstructive sleep apnea (adult) (pediatric): Secondary | ICD-10-CM | POA: Diagnosis present

## 2022-08-02 DIAGNOSIS — Z791 Long term (current) use of non-steroidal anti-inflammatories (NSAID): Secondary | ICD-10-CM | POA: Diagnosis not present

## 2022-08-02 DIAGNOSIS — E669 Obesity, unspecified: Secondary | ICD-10-CM | POA: Diagnosis present

## 2022-08-02 DIAGNOSIS — J449 Chronic obstructive pulmonary disease, unspecified: Secondary | ICD-10-CM

## 2022-08-02 DIAGNOSIS — K56600 Partial intestinal obstruction, unspecified as to cause: Secondary | ICD-10-CM | POA: Diagnosis not present

## 2022-08-02 DIAGNOSIS — Z7951 Long term (current) use of inhaled steroids: Secondary | ICD-10-CM | POA: Diagnosis not present

## 2022-08-02 DIAGNOSIS — Z79818 Long term (current) use of other agents affecting estrogen receptors and estrogen levels: Secondary | ICD-10-CM | POA: Diagnosis not present

## 2022-08-02 DIAGNOSIS — Z8249 Family history of ischemic heart disease and other diseases of the circulatory system: Secondary | ICD-10-CM | POA: Diagnosis not present

## 2022-08-02 DIAGNOSIS — R188 Other ascites: Secondary | ICD-10-CM | POA: Diagnosis present

## 2022-08-02 DIAGNOSIS — R1084 Generalized abdominal pain: Secondary | ICD-10-CM | POA: Diagnosis not present

## 2022-08-02 DIAGNOSIS — Z923 Personal history of irradiation: Secondary | ICD-10-CM

## 2022-08-02 DIAGNOSIS — Z79899 Other long term (current) drug therapy: Secondary | ICD-10-CM | POA: Diagnosis not present

## 2022-08-02 DIAGNOSIS — D72829 Elevated white blood cell count, unspecified: Secondary | ICD-10-CM | POA: Diagnosis present

## 2022-08-02 DIAGNOSIS — N400 Enlarged prostate without lower urinary tract symptoms: Secondary | ICD-10-CM | POA: Diagnosis present

## 2022-08-02 DIAGNOSIS — E785 Hyperlipidemia, unspecified: Secondary | ICD-10-CM | POA: Diagnosis present

## 2022-08-02 DIAGNOSIS — M1990 Unspecified osteoarthritis, unspecified site: Secondary | ICD-10-CM | POA: Diagnosis present

## 2022-08-02 DIAGNOSIS — Z6832 Body mass index (BMI) 32.0-32.9, adult: Secondary | ICD-10-CM | POA: Diagnosis not present

## 2022-08-02 DIAGNOSIS — K56609 Unspecified intestinal obstruction, unspecified as to partial versus complete obstruction: Secondary | ICD-10-CM

## 2022-08-02 DIAGNOSIS — F419 Anxiety disorder, unspecified: Secondary | ICD-10-CM | POA: Diagnosis present

## 2022-08-02 LAB — URINALYSIS, ROUTINE W REFLEX MICROSCOPIC
Bilirubin Urine: NEGATIVE
Glucose, UA: NEGATIVE mg/dL
Hgb urine dipstick: NEGATIVE
Ketones, ur: NEGATIVE mg/dL
Leukocytes,Ua: NEGATIVE
Nitrite: NEGATIVE
Protein, ur: NEGATIVE mg/dL
Specific Gravity, Urine: 1.038 — ABNORMAL HIGH (ref 1.005–1.030)
pH: 6 (ref 5.0–8.0)

## 2022-08-02 LAB — CBC
HCT: 42.8 % (ref 39.0–52.0)
Hemoglobin: 14.1 g/dL (ref 13.0–17.0)
MCH: 28.4 pg (ref 26.0–34.0)
MCHC: 32.9 g/dL (ref 30.0–36.0)
MCV: 86.3 fL (ref 80.0–100.0)
Platelets: 340 10*3/uL (ref 150–400)
RBC: 4.96 MIL/uL (ref 4.22–5.81)
RDW: 13.9 % (ref 11.5–15.5)
WBC: 11.2 10*3/uL — ABNORMAL HIGH (ref 4.0–10.5)
nRBC: 0 % (ref 0.0–0.2)

## 2022-08-02 LAB — COMPREHENSIVE METABOLIC PANEL
ALT: 8 U/L (ref 0–44)
AST: 11 U/L — ABNORMAL LOW (ref 15–41)
Albumin: 4.2 g/dL (ref 3.5–5.0)
Alkaline Phosphatase: 55 U/L (ref 38–126)
Anion gap: 12 (ref 5–15)
BUN: 17 mg/dL (ref 8–23)
CO2: 29 mmol/L (ref 22–32)
Calcium: 9.9 mg/dL (ref 8.9–10.3)
Chloride: 96 mmol/L — ABNORMAL LOW (ref 98–111)
Creatinine, Ser: 1.21 mg/dL (ref 0.61–1.24)
GFR, Estimated: >60 mL/min (ref >60)
Glucose, Bld: 151 mg/dL — ABNORMAL HIGH (ref 70–99)
Potassium: 4.3 mmol/L (ref 3.5–5.1)
Sodium: 137 mmol/L (ref 135–145)
Total Bilirubin: 0.7 mg/dL (ref 0.3–1.2)
Total Protein: 7.4 g/dL (ref 6.5–8.1)

## 2022-08-02 LAB — LIPASE, BLOOD: Lipase: 10 U/L — ABNORMAL LOW (ref 11–51)

## 2022-08-02 LAB — LACTIC ACID, PLASMA: Lactic Acid, Venous: 1.3 mmol/L (ref 0.5–1.9)

## 2022-08-02 MED ORDER — HYDROMORPHONE HCL 1 MG/ML IJ SOLN
0.5000 mg | Freq: Once | INTRAMUSCULAR | Status: AC
  Administered 2022-08-02: 0.5 mg via INTRAVENOUS
  Filled 2022-08-02: qty 1

## 2022-08-02 MED ORDER — IOHEXOL 300 MG/ML  SOLN
100.0000 mL | Freq: Once | INTRAMUSCULAR | Status: AC | PRN
  Administered 2022-08-02: 80 mL via INTRAVENOUS

## 2022-08-02 MED ORDER — ONDANSETRON HCL 4 MG/2ML IJ SOLN: 4.0000 mg | Freq: Four times a day (QID) | INTRAMUSCULAR | Status: DC | PRN

## 2022-08-02 MED ORDER — ONDANSETRON HCL 4 MG PO TABS: 4.0000 mg | ORAL_TABLET | Freq: Four times a day (QID) | ORAL | Status: DC | PRN

## 2022-08-02 MED ORDER — ALBUTEROL SULFATE (2.5 MG/3ML) 0.083% IN NEBU: 3.0000 mL | INHALATION_SOLUTION | RESPIRATORY_TRACT | Status: DC | PRN

## 2022-08-02 MED ORDER — HEPARIN SODIUM (PORCINE) 5000 UNIT/ML IJ SOLN
5000.0000 [IU] | Freq: Three times a day (TID) | INTRAMUSCULAR | Status: DC
  Administered 2022-08-02 – 2022-08-05 (×8): 5000 [IU] via SUBCUTANEOUS
  Filled 2022-08-02 (×8): qty 1

## 2022-08-02 MED ORDER — BUDESONIDE 0.5 MG/2ML IN SUSP
0.5000 mg | Freq: Two times a day (BID) | RESPIRATORY_TRACT | Status: DC
  Administered 2022-08-03 – 2022-08-05 (×5): 0.5 mg via RESPIRATORY_TRACT
  Filled 2022-08-02 (×5): qty 2

## 2022-08-02 MED ORDER — SODIUM CHLORIDE 0.9 % IV SOLN: INTRAVENOUS | Status: AC

## 2022-08-02 MED ORDER — HYDROMORPHONE HCL 1 MG/ML IJ SOLN: 0.5000 mg | INTRAMUSCULAR | Status: DC | PRN

## 2022-08-02 NOTE — Progress Notes (Signed)
Plan of Care Note for accepted transfer   Patient: Gregory Ewing MRN: [id number]   DOA: 08/02/2022  Facility requesting transfer: [facility] ED. Requesting Provider: Dr. Jearld Fenton, EDP. Reason for transfer: Partial small bowel obstruction.  Facility course: The patient is a 77 year old male with past medical history significant for hypertension, hyperlipidemia, iron deficiency anemia, BPH, chronic anxiety/depression who initially presented to urgent care due to progressive sharp abdominal pain around his abdominal surgical scar for the past 2 days.  Associated with difficulty to pass flatulence today, constipation with no bowel movement since Friday 3 days ago.  No associated vomiting chest pain or shortness of breath.  The patient has a history of prior abdominal surgery in his 49s, he is not sure why he had the surgery.  He was advised to go to the ED for further evaluation.  In the ED, vital signs are stable.  Mild leukocytosis, 11.2, on CBC.  CT abdomen and pelvis with contrast revealed the following findings: Multiple dilated loops of small bowel approaching 3.7 cm with caliber change in the central pelvic mesentery. Developing or partial obstruction. Adjacent stranding and trace ascites. No free air or pneumatosis at this time.   Enlarged prostate with mass effect along the base of the bladder. Slight wall thickening of the bladder and trabeculation. Please correlate with patient's PSA.   Surgical changes along the anterior pelvic wall.   Significant vascular calcifications noted including atherosclerotic changes along the mesenteric vessels.  EDP discussed the case with general surgery Dr. Donell Beers who recommended admission by the medicine team.  The patient received opiate-based IV analgesics.  Admitted to [facility] MedSurg unit with remote telemetry as inpatient status.  Please contact general surgery once the patient arrives to the hospital.   Plan of care: The patient is  accepted for admission to Med-surg  unit, at [facility].  Author: Darlin Drop, DO 08/02/2022  Check [email or web address] for on-call coverage.  Nursing staff, Please call [facility] Admits & Consults System-Wide number on Amion as soon as patient's arrival, so appropriate admitting provider can evaluate the pt.

## 2022-08-02 NOTE — ED Notes (Signed)
Patient is being discharged from the Urgent Care and sent to the Emergency Department via self . Per haley, patient is in need of higher level of care due to abd pain. Patient is aware and verbalizes understanding of plan of care.  Vitals:   08/02/22 1432  BP: (!) 149/72  Pulse: 89  Resp: 16  Temp: 98.5 F (36.9 C)  SpO2: 97%

## 2022-08-02 NOTE — ED Triage Notes (Signed)
Pt c/o abd pain and not having a BM in 2 days. Associated belching today. Has been taking mobic 7.5 for abd pain given by [place].   Also c/o coughing more frequently concerned for bronchitis.

## 2022-08-02 NOTE — H&P (Signed)
History and Physical    Gregory Ewing [id number] DOB: 1946-02-18 DOA: 08/02/2022  PCP: Concha Pyo, MD   Patient coming from: Home   Chief Complaint: Abdominal pain, belching    HPI: Gregory Ewing is a 77 y.o. male with medical history significant for prostate cancer status post radiation, hypertension, hyperlipidemia, OSA, and COPD who presents to the emergency department with abdominal pain and belching.  Reports 2 to 3 days of abdominal pain that has been severe at times.  Last bowel movement was 2 days ago.  He was having difficulty passing flatus but did pass a minimal amount earlier today and again shortly after arrival to [facility].  He denies vomiting. He has been drinking water the past 2 days, had a cup of coffee, and was able to keep his medications down this morning, but has not attempted to eat.    He has history of 2 abdominal surgeries in the 1970's and states he had 18 inches of small bowel removed.   [facility] ED Course: Upon arrival to the ED, patient is found to be afebrile and saturating well on room air with normal heart rate and elevated blood pressure.  ED workup notable for normal lactic acid and CT findings that include loops of dilated small bowel concerning for developing or partial obstruction.  Surgery (Dr. Donell Beers) was consulted by the ED physician, patient was treated with Dilaudid, and he was transferred to [facility] for admission.  Review of Systems:  All other systems reviewed and apart from HPI, are negative.  Past Medical History:  Diagnosis Date   Arthritis    Astigmatism    Family history of breast cancer    Hyperlipidemia    Hypertension    Leukocytosis    LFTs abnormal    Obstructive sleep apnea    Presbyopia    Prostate cancer     Past Surgical History:  Procedure Laterality Date   APPENDECTOMY     LUMBAR DISC SURGERY     partial small bowel resection     TONSILLECTOMY      Social History:    reports that he has never smoked. He has never used smokeless tobacco. He reports that he does not drink alcohol and does not use drugs.  No Known Allergies  Family History  Problem Relation Age of Onset   Breast cancer Mother 90   Diabetes Daughter        d. 56   Heart attack Paternal Uncle    Heart attack Maternal Grandmother    Heart attack Maternal Grandfather    Heart attack Paternal Grandmother    Heart attack Paternal Grandfather    Throat cancer Brother        smoker and drinker   Heart attack Brother    Esophageal cancer Brother    Cervical cancer Daughter    Asthma Granddaughter    Colon cancer Neg Hx    Pancreatic cancer Neg Hx    Rectal cancer Neg Hx    Stomach cancer Neg Hx      Prior to Admission medications   Medication Sig Start Date End Date Taking? Authorizing Provider  acetaminophen (TYLENOL) 500 MG tablet Take 1,000 mg by mouth 4 (four) times daily. Patient not taking: Reported on 06/24/2022    [provider]  Calcium Carb-Cholecalciferol (CALCIUM/VITAMIN D) 600-400 MG-UNIT TABS Take by mouth.    [provider]  cetirizine (ZYRTEC) 10 MG tablet Take 10 mg by mouth daily.  [provider]  citalopram (CELEXA) 40 MG tablet Take 40 mg by mouth daily. 1/2 tab daily    [provider]  Cyanocobalamin (B-12 COMPLIANCE INJECTION IJ) Inject as directed.    [provider]  ferrous sulfate 325 (65 FE) MG tablet Take 325 mg by mouth daily with breakfast. mwf    [provider]  fluticasone (FLONASE) 50 MCG/ACT nasal spray Place 2 sprays into both nostrils daily.    [provider]  furosemide (LASIX) 20 MG tablet Take 20 mg by mouth daily. 05/14/21   [provider]  gabapentin (NEURONTIN) 300 MG capsule Take 1 capsule by mouth at bedtime. 05/16/20   [provider]  ibuprofen (ADVIL) 200 MG tablet Take 200 mg by mouth every 6 (six) hours as needed.    [provider]  Leuprolide  Acetate, 3 Month, (LUPRON DEPOT, 47-MONTH, IM) Inject into the muscle every 3 (three) months.    [provider]  loperamide (IMODIUM) 2 MG capsule Take 2 mg by mouth as needed. 07/02/20   [provider]  losartan (COZAAR) 100 MG tablet Take 100 mg by mouth daily. 11/19/20   [provider]  lovastatin (MEVACOR) 20 MG tablet Take 20 mg by mouth at bedtime.    [provider]  meloxicam (MOBIC) 7.5 MG tablet Take 7.5 mg by mouth daily.    [provider]  metoprolol succinate (TOPROL-XL) 50 MG 24 hr tablet Take 1 tablet (50 mg total) by mouth daily. Take with or immediately following a meal. 05/13/22 08/11/22  Custovic, Rozell Searing, DO  mometasone (ASMANEX 60 METERED DOSES) 220 MCG/INH inhaler Inhale 2 puffs into the lungs daily. 09/10/17   Kalman Shan, MD  NON FORMULARY Immune support OTC vitamin c 1000 mg daily    [provider]  tamsulosin (FLOMAX) 0.4 MG CAPS capsule Take 0.8 mg by mouth.     [provider]  traMADol (ULTRAM) 50 MG tablet Take 50 mg by mouth every 6 (six) hours as needed.    [provider]  VENTOLIN HFA 108 [phone] Base) MCG/ACT inhaler  06/16/17   [provider]    Physical Exam: Vitals:   08/02/22 1900 08/02/22 2100 08/02/22 2108 08/02/22 2218  BP: 134/62 (!) 157/60  (!) 195/74  Pulse: 66 68  72  Resp: 16 18  18   Temp:   98.8 F (37.1 C) 98 F (36.7 C)  TempSrc:   Oral Oral  SpO2: 94% 95%  99%  Weight:      Height:         Constitutional: NAD, calm  Eyes: PERTLA, lids and conjunctivae normal ENMT: Mucous membranes are moist. Posterior pharynx clear of any exudate or lesions.   Neck: supple, no masses  Respiratory: clear to auscultation bilaterally, no wheezing, no crackles. No accessory muscle use.  Cardiovascular: S1 & S2 heard, regular rate and rhythm. No extremity edema. No significant JVD. Abdomen: soft, no rebound pain or guarding. Bowel sounds hypoactive.  Musculoskeletal: no  clubbing / cyanosis. No joint deformity upper and lower extremities.   Skin: no significant rashes, lesions, ulcers. Warm, dry, well-perfused. Neurologic: CN 2-12 grossly intact. Moving all extremities. Alert and oriented.  Psychiatric: Pleasant. Cooperative.    Labs and Imaging on Admission: I have personally reviewed following labs and imaging studies  CBC: Recent Labs  Lab 08/02/22 1542  WBC 11.2*  HGB 14.1  HCT 42.8  MCV 86.3  PLT 340   Basic Metabolic Panel: Recent Labs  Lab 08/02/22 1542  NA 137  K 4.3  CL 96*  CO2 29  GLUCOSE 151*  BUN 17  CREATININE 1.21  CALCIUM 9.9   GFR: Estimated Creatinine Clearance: 52.1 mL/min (by C-G formula based on SCr of 1.21 mg/dL). Liver Function Tests: Recent Labs  Lab 08/02/22 1542  AST 11*  ALT 8  ALKPHOS 55  BILITOT 0.7  PROT 7.4  ALBUMIN 4.2   Recent Labs  Lab 08/02/22 1542  LIPASE 10*   No results for input(s): "AMMONIA" in the last 168 hours. Coagulation Profile: No results for input(s): "INR", "PROTIME" in the last 168 hours. Cardiac Enzymes: No results for input(s): "CKTOTAL", "CKMB", "CKMBINDEX", "TROPONINI" in the last 168 hours. BNP (last 3 results) No results for input(s): "PROBNP" in the last 8760 hours. HbA1C: No results for input(s): "HGBA1C" in the last 72 hours. CBG: No results for input(s): "GLUCAP" in the last 168 hours. Lipid Profile: No results for input(s): "CHOL", "HDL", "LDLCALC", "TRIG", "CHOLHDL", "LDLDIRECT" in the last 72 hours. Thyroid Function Tests: No results for input(s): "TSH", "T4TOTAL", "FREET4", "T3FREE", "THYROIDAB" in the last 72 hours. Anemia Panel: No results for input(s): "VITAMINB12", "FOLATE", "FERRITIN", "TIBC", "IRON", "RETICCTPCT" in the last 72 hours. Urine analysis:    Component Value Date/Time   COLORURINE YELLOW 08/02/2022 2151   APPEARANCEUR CLEAR 08/02/2022 2151   LABSPEC 1.038 (H) 08/02/2022 2151   PHURINE 6.0 08/02/2022 2151   GLUCOSEU NEGATIVE  08/02/2022 2151   HGBUR NEGATIVE 08/02/2022 2151   BILIRUBINUR NEGATIVE 08/02/2022 2151   KETONESUR NEGATIVE 08/02/2022 2151   PROTEINUR NEGATIVE 08/02/2022 2151   NITRITE NEGATIVE 08/02/2022 2151   LEUKOCYTESUR NEGATIVE 08/02/2022 2151   Sepsis Labs: (procalcitonin:4,lacticidven:4) )No results found for this or any previous visit (from the past 240 hour(s)).   Radiological Exams on Admission: CT ABDOMEN PELVIS W CONTRAST  Result Date: 08/02/2022 CLINICAL DATA:  Abdominal pain.  No bowel movement for 2 days EXAM: CT ABDOMEN AND PELVIS WITH CONTRAST TECHNIQUE: Multidetector CT imaging of the abdomen and pelvis was performed using the standard protocol following bolus administration of intravenous contrast. RADIATION DOSE REDUCTION: This exam was performed according to the departmental dose-optimization program which includes automated exposure control, adjustment of the mA and/or kV according to patient size and/or use of iterative reconstruction technique. CONTRAST:  80mL OMNIPAQUE IOHEXOL 300 MG/ML  SOLN COMPARISON:  None Available. FINDINGS: Lower chest: No acute abnormality. Hepatobiliary: Patent portal vein. No space-occupying liver lesion. Distended gallbladder. Pancreas: Unremarkable. No pancreatic ductal dilatation or surrounding inflammatory changes. Spleen: Small splenule. Preserved enhancement to the normal-sized spleen. Adrenals/Urinary Tract: Right adrenal gland is preserved. The left is slightly thickened, nonspecific. No enhancing renal mass or collecting system dilatation. The ureters have normal course and caliber extending down to the bladder. Slight bladder wall thickening with the bladder is underdistended. Stomach/Bowel: Stomach is nondilated. Normal caliber duodenal. There are some dilated fluid-filled loops mid to distal jejunum and proximal ileum. Focal transition point in the central pelvic mesentery right of midline as seen on coronal image 56, axial image 56.  Developing or partial obstruction. No pneumatosis. Colon is decompressed. The appendix is not seen in the right lower quadrant. No pericecal stranding or fluid Vascular/Lymphatic: . normal caliber aorta and IVC with scattered vascular calcifications. Calcifications are also seen along branch vessels including the celiac axis, SMA and IMA. Reproductive: Enlarged prostate with mass effect along the base of the bladder. Other: Small fat containing umbilical hernias. Surgical changes along the anterior pelvic wall in the  midline. Small fat containing umbilical hernia. Musculoskeletal: Scattered degenerative changes of the spine. Disc bulging at L4-5 with some stenosis. Degenerative changes along the pelvis as well with bridging osteophytes along the sacroiliac joints. IMPRESSION: Multiple dilated loops of small bowel approaching 3.7 cm with caliber change in the central pelvic mesentery. Developing or partial obstruction. Adjacent stranding and trace ascites. No free air or pneumatosis at this time. Enlarged prostate with mass effect along the base of the bladder. Slight wall thickening of the bladder and trabeculation. Please correlate with patient's PSA. Surgical changes along the anterior pelvic wall. Significant vascular calcifications noted including atherosclerotic changes along the mesenteric vessels. Electronically Signed   By: Karen Kays M.D.   On: 08/02/2022 18:11     Assessment/Plan   1. Partial SBO  - Bowel rest, IVF hydration, pain-control, monitor volume status and electrolytes, follow-up on surgery recommendations  - NGT not placed given absence of gastric distension and N/V    2. Hypertension  - Treat as-needed only for now    3. COPD  - Not in exacerbation   - Continue ICS and as-needed albuterol    DVT prophylaxis: sq heparin  Code Status: Full  Level of Care: Level of care: Med-Surg Family Communication: Son at bedside   Disposition Plan:  Patient is from: home  Anticipated  d/c is to: Home Anticipated d/c date is: 08/05/22  Patient currently: Pending return of bowel function  Consults called: Surgery  Admission status: Inpatient     Briscoe Deutscher, MD [facility]  08/02/2022, 10:53 PM

## 2022-08-02 NOTE — ED Provider Notes (Signed)
[name]    CSN: [id number] Arrival date & time: 08/02/22  1359      History   Chief Complaint Chief Complaint  Patient presents with   Abdominal Pain    HPI Gregory Ewing is a 77 y.o. male.   Patient presents with 2 different chief complaints today.  Reports abdominal pain that started about 2 to 3 days ago.  Patient reports the pain is typically 8/10 on pain scale and is worse at night causing him to not be able to get much sleep.  Pain currently is rated 6/10 on pain scale, and is constant, and described as a sharp pain.  Movement exacerbates pain.  He also reports some increased belching.  He has not had a bowel movement in 2 days.  He reports difficulty passing flatulence as well but was able to pass flatulence today.  Denies any associated fever.  Patient reports history of significant stomach surgery his 75s due to a condition that he is not sure the exact name of.  Reports that the pain is in the mid abdomen surrounding the surgical scar.  Patient also has a cough that developed about 2 days ago.  Reports it is productive.  He typically has a cough but reports the production is turned from white to yellow.  Reports some minimal runny nose as well.  Denies fever.  He has been around somebody with similar symptoms.  Reports that he takes a medication for "mucus" but is not sure the name of it.  He has also been told that he has COPD and uses inhalers for this.  Denies chest pain or shortness of breath.   Abdominal Pain   Past Medical History:  Diagnosis Date   Arthritis    Astigmatism    Family history of breast cancer    Hyperlipidemia    Hypertension    Leukocytosis    LFTs abnormal    Obstructive sleep apnea    Presbyopia    Prostate cancer     Patient Active Problem List   Diagnosis Date Noted   Obstructive sleep apnea 10/24/2017   Chronic cough 09/10/2017   Irritable larynx 09/10/2017   Genetic testing 08/31/2017   Family history of breast  cancer    Malignant neoplasm of prostate 12/22/2016   Leukocytosis    LFTs abnormal     Past Surgical History:  Procedure Laterality Date   APPENDECTOMY     LUMBAR DISC SURGERY     partial small bowel resection     TONSILLECTOMY         Home Medications    Prior to Admission medications   Medication Sig Start Date End Date Taking? Authorizing Provider  meloxicam (MOBIC) 7.5 MG tablet Take 7.5 mg by mouth daily.   Yes [provider]  acetaminophen (TYLENOL) 500 MG tablet Take 1,000 mg by mouth 4 (four) times daily. Patient not taking: Reported on 06/24/2022    [provider]  Calcium Carb-Cholecalciferol (CALCIUM/VITAMIN D) 600-400 MG-UNIT TABS Take by mouth.    [provider]  cetirizine (ZYRTEC) 10 MG tablet Take 10 mg by mouth daily.    [provider]  citalopram (CELEXA) 40 MG tablet Take 40 mg by mouth daily. 1/2 tab daily    [provider]  Cyanocobalamin (B-12 COMPLIANCE INJECTION IJ) Inject as directed.    [provider]  ferrous sulfate 325 (65 FE) MG tablet Take 325 mg by mouth daily with breakfast. mwf  [provider]  fluticasone (FLONASE) 50 MCG/ACT nasal spray Place 2 sprays into both nostrils daily.    [provider]  furosemide (LASIX) 20 MG tablet Take 20 mg by mouth daily. 05/14/21   [provider]  gabapentin (NEURONTIN) 300 MG capsule Take 1 capsule by mouth at bedtime. 05/16/20   [provider]  ibuprofen (ADVIL) 200 MG tablet Take 200 mg by mouth every 6 (six) hours as needed.    [provider]  Leuprolide Acetate, 3 Month, (LUPRON DEPOT, 85-MONTH, IM) Inject into the muscle every 3 (three) months.    [provider]  loperamide (IMODIUM) 2 MG capsule Take 2 mg by mouth as needed. 07/02/20   [provider]  losartan (COZAAR) 100 MG tablet Take 100 mg by mouth daily. 11/19/20   [provider]  lovastatin (MEVACOR) 20 MG tablet Take  20 mg by mouth at bedtime.    [provider]  metoprolol succinate (TOPROL-XL) 50 MG 24 hr tablet Take 1 tablet (50 mg total) by mouth daily. Take with or immediately following a meal. 05/13/22 08/11/22  Custovic, Rozell Searing, DO  mometasone (ASMANEX 60 METERED DOSES) 220 MCG/INH inhaler Inhale 2 puffs into the lungs daily. 09/10/17   Kalman Shan, MD  NON FORMULARY Immune support OTC vitamin c 1000 mg daily    [provider]  tamsulosin (FLOMAX) 0.4 MG CAPS capsule Take 0.8 mg by mouth.     [provider]  traMADol (ULTRAM) 50 MG tablet Take 50 mg by mouth every 6 (six) hours as needed.    [provider]  VENTOLIN HFA 108 [phone] Base) MCG/ACT inhaler  06/16/17   [provider]    Family History Family History  Problem Relation Age of Onset   Breast cancer Mother 74   Diabetes Daughter        d. 81   Heart attack Paternal Uncle    Heart attack Maternal Grandmother    Heart attack Maternal Grandfather    Heart attack Paternal Grandmother    Heart attack Paternal Grandfather    Throat cancer Brother        smoker and drinker   Heart attack Brother    Esophageal cancer Brother    Cervical cancer Daughter    Asthma Granddaughter    Colon cancer Neg Hx    Pancreatic cancer Neg Hx    Rectal cancer Neg Hx    Stomach cancer Neg Hx     Social History Social History   Tobacco Use   Smoking status: Never   Smokeless tobacco: Never  Vaping Use   Vaping Use: Never used  Substance Use Topics   Alcohol use: No   Drug use: No     Allergies   Patient has no known allergies.   Review of Systems Review of Systems Per HPI  Physical Exam Triage Vital Signs ED Triage Vitals  Enc Vitals Group     BP 08/02/22 1432 (!) 149/72     Pulse Rate 08/02/22 1432 89     Resp 08/02/22 1432 16     Temp 08/02/22 1432 98.5 F (36.9 C)     Temp Source 08/02/22 1432 Oral     SpO2 08/02/22 1432 97 %     Weight --      Height --      Head  Circumference --      Peak Flow --      Pain Score 08/02/22 1433 6  Pain Loc --      Pain Edu? --      Excl. in GC? --    No data found.  Updated Vital Signs BP (!) 149/72 (BP Location: Left Arm)   Pulse 89   Temp 98.5 F (36.9 C) (Oral)   Resp 16   SpO2 97%   Visual Acuity Right Eye Distance:   Left Eye Distance:   Bilateral Distance:    Right Eye Near:   Left Eye Near:    Bilateral Near:     Physical Exam Constitutional:      General: He is not in acute distress.    Appearance: Normal appearance. He is not toxic-appearing or diaphoretic.  HENT:     Head: Normocephalic and atraumatic.     Ears:     Comments: Deferred given abdominal pain.     Nose:     Comments: Deferred given abdominal pain.     Mouth/Throat:     Comments: Deferred given abdominal pain.  Eyes:     Extraocular Movements: Extraocular movements intact.     Conjunctiva/sclera: Conjunctivae normal.  Cardiovascular:     Rate and Rhythm: Normal rate and regular rhythm.     Pulses: Normal pulses.     Heart sounds: Normal heart sounds.  Pulmonary:     Effort: Pulmonary effort is normal. No respiratory distress.     Breath sounds: Normal breath sounds.  Abdominal:     General: Bowel sounds are normal. There is distension.     Palpations: Abdomen is soft.     Tenderness: There is abdominal tenderness.       Comments: Patient has a surgical scar throughout mid abdomen.  Little bit of distention in the mid upper abdomen surrounding the surgical scar and patient has significant tenderness to palpation in this area.  Neurological:     General: No focal deficit present.     Mental Status: He is alert and oriented to person, place, and time. Mental status is at baseline.  Psychiatric:        Mood and Affect: Mood normal.        Behavior: Behavior normal.        Thought Content: Thought content normal.        Judgment: Judgment normal.      UC Treatments / Results  Labs (all labs ordered are  listed, but only abnormal results are displayed) Labs Reviewed - No data to display  EKG   Radiology No results found.  Procedures Procedures (including critical care time)  Medications Ordered in UC Medications - No data to display  Initial Impression / Assessment and Plan / UC Course  I have reviewed the triage vital signs and the nursing notes.  Pertinent labs & imaging results that were available during my care of the patient were reviewed by me and considered in my medical decision making (see chart for details).     1.  Abdominal pain  I am concerned for obstruction given patient is having constipation and significant abdominal pain.  I do think that CT imaging is necessary which cannot be provided here at urgent care.  Therefore, patient was advised to go to the ER for further evaluation and management and was agreeable with this plan.  Vital signs and patient stable at discharge.  Agree with patient's son transporting him to the ER.  2.  Cough  Suspect possible COPD exacerbation.  Although, patient is not tachypneic and lung sounds are normal.  Vital  signs are also stable.  Patient most likely needs a chest x-ray but will defer imaging and further treatment for cough given immediate need for attention for abdominal pain.  Will defer treatment and evaluation to the ER for cough and patient was advised to notify the ED provider of cough.  Patient was agreeable with this. Final Clinical Impressions(s) / UC Diagnoses   Final diagnoses:  Abdominal pain, unspecified abdominal location  Acute cough   Discharge Instructions   None    ED Prescriptions   None    PDMP not reviewed this encounter.   Gustavus Bryant, [place] 08/02/22 [phone]

## 2022-08-02 NOTE — ED Triage Notes (Signed)
Seen at [facility] UC today- sent here. Sharp abdominal pain x2 days. No BM since Friday-typically every other day. Passing gas and belching today. -N/-V, -CP,-SOB.  HX COPD.

## 2022-08-02 NOTE — ED Notes (Signed)
Dr Naasz at bedside  

## 2022-08-02 NOTE — ED Notes (Signed)
[facility] at bedside for transport. 

## 2022-08-02 NOTE — ED Provider Notes (Signed)
[place] [facility] AT [facility] Provider Note   CSN: [id number] Arrival date & time: 08/02/22  1517     History  Chief Complaint  Patient presents with   Abdominal Pain    Gregory Ewing is a 77 y.o. male with OSA, COPD who presents with abd pain.   Patient presents with generalized abdominal pain associated with sharp abdominal pain x 2 days that is intermittent.  He normally does not have a problem with constipation but he has not had a bowel movement since Friday, 2 days ago.  He has had significant belching, but no nausea or vomiting. Is passing minimal gas per rectum.  Also denies fever/chills, chest pain, shortness of breath.  When he was approximately 77 years old he had a surgery that remove part of his intestine but he is not sure why.  He does not take any blood thinners.  Denies any urinary symptoms, hematochezia/melena, testicular/scrotal symptoms.   Abdominal Pain      Home Medications Prior to Admission medications   Medication Sig Start Date End Date Taking? Authorizing Provider  acetaminophen (TYLENOL) 500 MG tablet Take 1,000 mg by mouth 4 (four) times daily. Patient not taking: Reported on 06/24/2022    [provider]  Calcium Carb-Cholecalciferol (CALCIUM/VITAMIN D) 600-400 MG-UNIT TABS Take by mouth.    [provider]  cetirizine (ZYRTEC) 10 MG tablet Take 10 mg by mouth daily.    [provider]  citalopram (CELEXA) 40 MG tablet Take 40 mg by mouth daily. 1/2 tab daily    [provider]  Cyanocobalamin (B-12 COMPLIANCE INJECTION IJ) Inject as directed.    [provider]  ferrous sulfate 325 (65 FE) MG tablet Take 325 mg by mouth daily with breakfast. mwf    [provider]  fluticasone (FLONASE) 50 MCG/ACT nasal spray Place 2 sprays into both nostrils daily.    [provider]  furosemide (LASIX) 20 MG tablet Take 20 mg by mouth daily. 05/14/21   [provider]   gabapentin (NEURONTIN) 300 MG capsule Take 1 capsule by mouth at bedtime. 05/16/20   [provider]  ibuprofen (ADVIL) 200 MG tablet Take 200 mg by mouth every 6 (six) hours as needed.    [provider]  Leuprolide Acetate, 3 Month, (LUPRON DEPOT, 42-MONTH, IM) Inject into the muscle every 3 (three) months.    [provider]  loperamide (IMODIUM) 2 MG capsule Take 2 mg by mouth as needed. 07/02/20   [provider]  losartan (COZAAR) 100 MG tablet Take 100 mg by mouth daily. 11/19/20   [provider]  lovastatin (MEVACOR) 20 MG tablet Take 20 mg by mouth at bedtime.    [provider]  meloxicam (MOBIC) 7.5 MG tablet Take 7.5 mg by mouth daily.    [provider]  metoprolol succinate (TOPROL-XL) 50 MG 24 hr tablet Take 1 tablet (50 mg total) by mouth daily. Take with or immediately following a meal. 05/13/22 08/11/22  Custovic, Rozell Searing, DO  mometasone (ASMANEX 60 METERED DOSES) 220 MCG/INH inhaler Inhale 2 puffs into the lungs daily. 09/10/17   Kalman Shan, MD  NON FORMULARY Immune support OTC vitamin c 1000 mg daily    [provider]  tamsulosin (FLOMAX) 0.4 MG CAPS capsule Take 0.8 mg by mouth.     [provider]  traMADol (ULTRAM) 50 MG tablet Take 50 mg by mouth every 6 (six) hours as needed.    [provider]  VENTOLIN HFA 108 (90 Base) MCG/ACT inhaler  06/16/17   [provider]      Allergies    Patient has no known allergies.    Review of Systems   Review of Systems  Gastrointestinal:  Positive for abdominal pain.   Review of systems Negative for f/c.  A 10 point review of systems was performed and is negative unless otherwise reported in HPI.  Physical Exam Updated Vital Signs BP 134/62 (BP Location: Right Arm)   Pulse 66   Temp 97.8 F (36.6 C) (Oral)   Resp 16   Ht 5\' 4"  (1.626 m)   Wt 85.7 kg   SpO2 94%   BMI 32.44 kg/m  Physical Exam General: Normal appearing male,  lying in bed.  HEENT: Sclera anicteric, MMM, trachea midline.  Cardiology: RRR, no murmurs/rubs/gallops. BL radial and DP pulses equal bilaterally.  Resp: Normal respiratory rate and effort. CTAB, no wheezes, rhonchi, crackles.  Abd: Diffusely tender to palpation, especially centrally. Mild distension. Soft. No rebound tenderness or guarding.  GU: Deferred. MSK: No peripheral edema or signs of trauma.  Skin: warm, dry. Back: No CVA tenderness Neuro: A&Ox4, CNs II-XII grossly intact. MAEs. Sensation grossly intact.  Psych: Normal mood and affect.   ED Results / Procedures / Treatments   Labs (all labs ordered are listed, but only abnormal results are displayed) Labs Reviewed  LIPASE, BLOOD - Abnormal; Notable for the following components:      Result Value   Lipase 10 (*)    All other components within normal limits  COMPREHENSIVE METABOLIC PANEL - Abnormal; Notable for the following components:   Chloride 96 (*)    Glucose, Bld 151 (*)    AST 11 (*)    All other components within normal limits  CBC - Abnormal; Notable for the following components:   WBC 11.2 (*)    All other components within normal limits  LACTIC ACID, PLASMA  URINALYSIS, ROUTINE W REFLEX MICROSCOPIC    EKG None  Radiology CT ABDOMEN PELVIS W CONTRAST  Result Date: 08/02/2022 CLINICAL DATA:  Abdominal pain.  No bowel movement for 2 days EXAM: CT ABDOMEN AND PELVIS WITH CONTRAST TECHNIQUE: Multidetector CT imaging of the abdomen and pelvis was performed using the standard protocol following bolus administration of intravenous contrast. RADIATION DOSE REDUCTION: This exam was performed according to the departmental dose-optimization program which includes automated exposure control, adjustment of the mA and/or kV according to patient size and/or use of iterative reconstruction technique. CONTRAST:  80mL OMNIPAQUE IOHEXOL 300 MG/ML  SOLN COMPARISON:  None Available. FINDINGS: Lower chest: No acute abnormality.  Hepatobiliary: Patent portal vein. No space-occupying liver lesion. Distended gallbladder. Pancreas: Unremarkable. No pancreatic ductal dilatation or surrounding inflammatory changes. Spleen: Small splenule. Preserved enhancement to the normal-sized spleen. Adrenals/Urinary Tract: Right adrenal gland is preserved. The left is slightly thickened, nonspecific. No enhancing renal mass or collecting system dilatation. The ureters have normal course and caliber extending down to the bladder. Slight bladder wall thickening with the bladder is underdistended. Stomach/Bowel: Stomach is nondilated. Normal caliber duodenal. There are some dilated fluid-filled loops mid to distal jejunum and proximal ileum. Focal transition point in the central pelvic mesentery right of midline as seen on coronal image 56, axial image 56. Developing or partial obstruction. No pneumatosis. Colon is decompressed. The appendix is not seen in the right lower quadrant. No pericecal stranding or fluid Vascular/Lymphatic: . normal caliber aorta and IVC with scattered vascular calcifications. Calcifications are also seen along  branch vessels including the celiac axis, SMA and IMA. Reproductive: Enlarged prostate with mass effect along the base of the bladder. Other: Small fat containing umbilical hernias. Surgical changes along the anterior pelvic wall in the midline. Small fat containing umbilical hernia. Musculoskeletal: Scattered degenerative changes of the spine. Disc bulging at L4-5 with some stenosis. Degenerative changes along the pelvis as well with bridging osteophytes along the sacroiliac joints. IMPRESSION: Multiple dilated loops of small bowel approaching 3.7 cm with caliber change in the central pelvic mesentery. Developing or partial obstruction. Adjacent stranding and trace ascites. No free air or pneumatosis at this time. Enlarged prostate with mass effect along the base of the bladder. Slight wall thickening of the bladder and  trabeculation. Please correlate with patient's PSA. Surgical changes along the anterior pelvic wall. Significant vascular calcifications noted including atherosclerotic changes along the mesenteric vessels. Electronically Signed   By: Karen Kays M.D.   On: 08/02/2022 18:11    Procedures Procedures    Medications Ordered in ED Medications  HYDROmorphone (DILAUDID) injection 0.5 mg (0.5 mg Intravenous Given 08/02/22 1759)  iohexol (OMNIPAQUE) 300 MG/ML solution 100 mL (80 mLs Intravenous Contrast Given 08/02/22 1744)    ED Course/ Medical Decision Making/ A&P                          Medical Decision Making Amount and/or Complexity of Data Reviewed Labs: ordered. Decision-making details documented in ED Course. Radiology: ordered. Decision-making details documented in ED Course.  Risk Prescription drug management. Decision regarding hospitalization.    This patient presents to the ED for concern of abdominal pain, this involves an extensive number of treatment options, and is a complaint that carries with it a high risk of complications and morbidity.  I considered the following differential and admission for this acute, potentially life threatening condition.   MDM:    For DDX for abdominal pain includes but is not limited to:  Abdominal exam without peritoneal signs. No evidence of acute abdomen at this time.   Patient with bloated abdomen, constipation, belching; consider SBO at top of differential.  Also considered but lower suspicion for acute hepatobiliary disease (including acute cholecystitis or cholangitis), acute pancreatitis (neg lipase), acute pyelonephritis, acute appendicitis, vascular catastrophe, viscus perforation, or diverticulitis.    Clinical Course as of 08/02/22 2058  Wynelle Link Aug 02, 2022  1848 CT ABDOMEN PELVIS W CONTRAST Multiple dilated loops of small bowel approaching 3.7 cm with caliber change in the central pelvic mesentery. Developing or partial  obstruction. Adjacent stranding and trace ascites. No free air or pneumatosis at this time.  Enlarged prostate with mass effect along the base of the bladder. Slight wall thickening of the bladder and trabeculation. Please correlate with patient's PSA.  Surgical changes along the anterior pelvic wall.  Significant vascular calcifications noted including atherosclerotic changes along the mesenteric vessels.   [HN]  1848 WBC(!): 11.2 Mild leukocytosis [HN]  1947 D/w surgery, will admit to medicine. [HN]  2057 Lactic Acid, Venous: 1.3 [HN]  2057 Lipase(!): 10 [HN]  2058 Comprehensive metabolic panel(!) unrmearkable [HN]    Clinical Course User Index [HN] Loetta Rough, MD    Labs: I Ordered, and personally interpreted labs.  The pertinent results include:  those listed above  Imaging Studies ordered: I ordered imaging studies including CT abd pelvis I independently visualized and interpreted imaging. I agree with the radiologist interpretation  Additional history obtained from son at bedside, chart review.  Reevaluation: After the interventions noted above, I reevaluated the patient and found that they have :worsened  Social Determinants of Health: Patient lives independently   Disposition:  Developing or partial SBO. Admitted to medicine w/ surgery following  Co morbidities that complicate the patient evaluation  Past Medical History:  Diagnosis Date   Arthritis    Astigmatism    Family history of breast cancer    Hyperlipidemia    Hypertension    Leukocytosis    LFTs abnormal    Obstructive sleep apnea    Presbyopia    Prostate cancer      Medicines Meds ordered this encounter  Medications   HYDROmorphone (DILAUDID) injection 0.5 mg   iohexol (OMNIPAQUE) 300 MG/ML solution 100 mL    I have reviewed the patients home medicines and have made adjustments as needed  Problem List / ED Course: Problem List Items Addressed This Visit       Digestive    * (Principal) Partial small bowel obstruction - Primary                This note was created using dictation software, which may contain spelling or grammatical errors.    Loetta Rough, MD 08/02/22 [phone]

## 2022-08-02 NOTE — ED Notes (Signed)
Report given to [facility] for [facility] [phone]

## 2022-08-03 ENCOUNTER — Encounter (HOSPITAL_COMMUNITY): Payer: Self-pay | Admitting: Family Medicine

## 2022-08-03 ENCOUNTER — Inpatient Hospital Stay (HOSPITAL_COMMUNITY): Payer: No Typology Code available for payment source

## 2022-08-03 DIAGNOSIS — K56600 Partial intestinal obstruction, unspecified as to cause: Secondary | ICD-10-CM | POA: Diagnosis not present

## 2022-08-03 LAB — BASIC METABOLIC PANEL
Anion gap: 9 (ref 5–15)
BUN: 18 mg/dL (ref 8–23)
CO2: 28 mmol/L (ref 22–32)
Calcium: 8.8 mg/dL — ABNORMAL LOW (ref 8.9–10.3)
Chloride: 100 mmol/L (ref 98–111)
Creatinine, Ser: 1.2 mg/dL (ref 0.61–1.24)
GFR, Estimated: >60 mL/min (ref >60)
Glucose, Bld: 120 mg/dL — ABNORMAL HIGH (ref 70–99)
Potassium: 4.9 mmol/L (ref 3.5–5.1)
Sodium: 137 mmol/L (ref 135–145)

## 2022-08-03 LAB — MAGNESIUM: Magnesium: 2.4 mg/dL (ref 1.7–2.4)

## 2022-08-03 LAB — CBC
HCT: 39.7 % (ref 39.0–52.0)
Hemoglobin: 12.9 g/dL — ABNORMAL LOW (ref 13.0–17.0)
MCH: 28.5 pg (ref 26.0–34.0)
MCHC: 32.5 g/dL (ref 30.0–36.0)
MCV: 87.6 fL (ref 80.0–100.0)
Platelets: 287 10*3/uL (ref 150–400)
RBC: 4.53 MIL/uL (ref 4.22–5.81)
RDW: 13.8 % (ref 11.5–15.5)
WBC: 10 10*3/uL (ref 4.0–10.5)
nRBC: 0 % (ref 0.0–0.2)

## 2022-08-03 LAB — PHOSPHORUS: Phosphorus: 4.7 mg/dL — ABNORMAL HIGH (ref 2.5–4.6)

## 2022-08-03 MED ORDER — DIATRIZOATE MEGLUMINE & SODIUM 66-10 % PO SOLN
90.0000 mL | Freq: Once | ORAL | Status: AC
  Administered 2022-08-03: 90 mL via NASOGASTRIC
  Filled 2022-08-03: qty 90

## 2022-08-03 NOTE — Progress Notes (Signed)
PROGRESS NOTE    Gregory Ewing  ZOX:[id number] DOB: 1946-01-31 DOA: 08/02/2022 PCP: Concha Pyo, MD   Brief Narrative:  Gregory Ewing is a 77 y.o. male with medical history significant for prostate cancer status post radiation, hypertension, hyperlipidemia, OSA, and COPD who presents to the emergency department with abdominal pain and belching.   Reports 2 to 3 days of abdominal pain that has been severe at times.  Last bowel movement was 2 days ago.  He was having difficulty passing flatus but did pass a minimal amount earlier today and again shortly after arrival to [facility].  He denies vomiting. He has been drinking water the past 2 days, had a cup of coffee, and was able to keep his medications down this morning, but has not attempted to eat.     He has history of 2 abdominal surgeries in the 1970's and states he had 18 inches of small bowel removed.    [facility] ED Course: Upon arrival to the ED, patient is found to be afebrile and saturating well on room air with normal heart rate and elevated blood pressure.  ED workup notable for normal lactic acid and CT findings that include loops of dilated small bowel concerning for developing or partial obstruction.   Surgery (Dr. Donell Beers) was consulted by the ED physician, patient was treated with Dilaudid, and he was transferred to [facility] for admission.    Assessment & Plan:   Principal Problem:   Partial small bowel obstruction Active Problems:   Hypertension   Chronic obstructive pulmonary disease  #1 partial small bowel obstruction-patient has had few bowel movements today.  Followed by surgery planning for SBO protocol. Encourage activity out of bed ambulate Continue IV fluids hold Lasix CT abdomen multiple dilated loops of small bowel approaching 3.7 cm partial obstruction trace ascites enlarged prostate  #2 history of essential hypertension on Cozaar metoprolol at home  #3 COPD stable   #4  depression restart Celexa when able to take p.o.  #5 BPH on Flomax  #6 hyperlipidemia on Mevacor  Estimated body mass index is 32.44 kg/m as calculated from the following:   Height as of this encounter:  (1.626 m).   Weight as of this encounter: 85.7 kg.  DVT prophylaxis: lovenox Code Status: full Family Communication:son in room Disposition Plan:  Status is: Inpatient Remains inpatient appropriate because: sbo   Consultants:  surgery  Procedures:none Antimicrobials: none  Subjective: Had bm No vomiting  Objective: Vitals:   08/03/22 0127 08/03/22 0525 08/03/22 0800 08/03/22 0846  BP: (!) 145/58 (!) 141/61  (!) 145/58  Pulse: 64 64  65  Resp: Temp: 98.3 F (36.8 C) 98.3 F (36.8 C)  98.1 F (36.7 C)  TempSrc: Oral Oral  Oral  SpO2: 95% 95% 96% 97%  Weight:      Height:        Intake/Output Summary (Last 24 hours) at 08/03/2022 1138 Last data filed at 08/03/2022 0855 Gross per 24 hour  Intake 694.52 ml  Output 700 ml  Net -5.48 ml   Filed Weights   08/02/22 1533  Weight: 85.7 kg    Examination:  General exam: Appears calm and comfortable  Respiratory system: Clear to auscultation. Respiratory effort normal. Cardiovascular system: S1 & S2 heard, RRR. No JVD, murmurs, rubs, gallops or clicks. No pedal edema. Gastrointestinal system: Abdomen is distended, soft and tender. No organomegaly or masses felt. Normal bowel sounds heard.old surgical scar  Central nervous system: Alert and oriented. No focal neurological deficits. Extremities: Symmetric 5 x 5 power.  Data Reviewed: I have personally reviewed following labs and imaging studies  CBC: Recent Labs  Lab 08/02/22 1542 08/03/22 0448  WBC 11.2* 10.0  HGB 14.1 12.9*  HCT 42.8 39.7  MCV 86.3 87.6  PLT 340 287   Basic Metabolic Panel: Recent Labs  Lab 08/02/22 1542 08/03/22 0448  NA 137 137  K 4.3 4.9  CL 96* 100  CO2 29 28  GLUCOSE 151* 120*  BUN 17 18  CREATININE 1.21  1.20  CALCIUM 9.9 8.8*  MG  --  2.4  PHOS  --  4.7*   GFR: Estimated Creatinine Clearance: 52.5 mL/min (by C-G formula based on SCr of 1.2 mg/dL). Liver Function Tests: Recent Labs  Lab 08/02/22 1542  AST 11*  ALT 8  ALKPHOS 55  BILITOT 0.7  PROT 7.4  ALBUMIN 4.2   Recent Labs  Lab 08/02/22 1542  LIPASE 10*   No results for input(s): "AMMONIA" in the last 168 hours. Coagulation Profile: No results for input(s): "INR", "PROTIME" in the last 168 hours. Cardiac Enzymes: No results for input(s): "CKTOTAL", "CKMB", "CKMBINDEX", "TROPONINI" in the last 168 hours. BNP (last 3 results) No results for input(s): "PROBNP" in the last 8760 hours. HbA1C: No results for input(s): "HGBA1C" in the last 72 hours. CBG: No results for input(s): "GLUCAP" in the last 168 hours. Lipid Profile: No results for input(s): "CHOL", "HDL", "LDLCALC", "TRIG", "CHOLHDL", "LDLDIRECT" in the last 72 hours. Thyroid Function Tests: No results for input(s): "TSH", "T4TOTAL", "FREET4", "T3FREE", "THYROIDAB" in the last 72 hours. Anemia Panel: No results for input(s): "VITAMINB12", "FOLATE", "FERRITIN", "TIBC", "IRON", "RETICCTPCT" in the last 72 hours. Sepsis Labs: Recent Labs  Lab 08/02/22 1742  LATICACIDVEN 1.3    No results found for this or any previous visit (from the past 240 hour(s)).       Radiology Studies: CT ABDOMEN PELVIS W CONTRAST  Result Date: 08/02/2022 CLINICAL DATA:  Abdominal pain.  No bowel movement for 2 days EXAM: CT ABDOMEN AND PELVIS WITH CONTRAST TECHNIQUE: Multidetector CT imaging of the abdomen and pelvis was performed using the standard protocol following bolus administration of intravenous contrast. RADIATION DOSE REDUCTION: This exam was performed according to the departmental dose-optimization program which includes automated exposure control, adjustment of the mA and/or kV according to patient size and/or use of iterative reconstruction technique. CONTRAST:  80mL  OMNIPAQUE IOHEXOL 300 MG/ML  SOLN COMPARISON:  None Available. FINDINGS: Lower chest: No acute abnormality. Hepatobiliary: Patent portal vein. No space-occupying liver lesion. Distended gallbladder. Pancreas: Unremarkable. No pancreatic ductal dilatation or surrounding inflammatory changes. Spleen: Small splenule. Preserved enhancement to the normal-sized spleen. Adrenals/Urinary Tract: Right adrenal gland is preserved. The left is slightly thickened, nonspecific. No enhancing renal mass or collecting system dilatation. The ureters have normal course and caliber extending down to the bladder. Slight bladder wall thickening with the bladder is underdistended. Stomach/Bowel: Stomach is nondilated. Normal caliber duodenal. There are some dilated fluid-filled loops mid to distal jejunum and proximal ileum. Focal transition point in the central pelvic mesentery right of midline as seen on coronal image 56, axial image 56. Developing or partial obstruction. No pneumatosis. Colon is decompressed. The appendix is not seen in the right lower quadrant. No pericecal stranding or fluid Vascular/Lymphatic: . normal caliber aorta and IVC with scattered vascular calcifications. Calcifications are also seen along branch vessels including the celiac axis, SMA and IMA. Reproductive: Enlarged prostate  with mass effect along the base of the bladder. Other: Small fat containing umbilical hernias. Surgical changes along the anterior pelvic wall in the midline. Small fat containing umbilical hernia. Musculoskeletal: Scattered degenerative changes of the spine. Disc bulging at L4-5 with some stenosis. Degenerative changes along the pelvis as well with bridging osteophytes along the sacroiliac joints. IMPRESSION: Multiple dilated loops of small bowel approaching 3.7 cm with caliber change in the central pelvic mesentery. Developing or partial obstruction. Adjacent stranding and trace ascites. No free air or pneumatosis at this time.  Enlarged prostate with mass effect along the base of the bladder. Slight wall thickening of the bladder and trabeculation. Please correlate with patient's PSA. Surgical changes along the anterior pelvic wall. Significant vascular calcifications noted including atherosclerotic changes along the mesenteric vessels. Electronically Signed   By: Karen Kays M.D.   On: 08/02/2022 18:11        Scheduled Meds:  budesonide  0.5 mg Nebulization BID   heparin  5,000 Units Subcutaneous Q8H   Continuous Infusions:  sodium chloride 100 mL/hr at 08/02/22 2311     LOS: 1 day   Time spent:   Alwyn Ren, MD 08/03/2022, 11:38 AM

## 2022-08-03 NOTE — Consult Note (Signed)
Gregory Ewing Sep 22, 1945  [id number].    Requesting MD: Dr. Jerelene Redden Chief Complaint/Reason for Consult: SBO  HPI:  This is a pleasant 77 yo male with a history of prostate cancer, s/p radiation therapy and Lupron, HLD, and HTN, who has had an ex lap with SBR in the 1970s as well as an appendectomy.  He states that on Friday he developed some abdominal bloating and some discomfort.  This continued to progress although he never developed nausea or vomiting.  He was not passing flatus or a BM.  He has since had a BM just this morning.  Due to persistent symptoms, he presented to the ED yesterday where he underwent a work up which revealed multiple dilated loops of bowel in the central pelvic mesentery.  His labs are relatively unremarkable.  He has been admitted and we have been asked to see.  ROS: ROS: Please see HPI  Family History  Problem Relation Age of Onset   Breast cancer Mother 5   Diabetes Daughter        d. 74   Heart attack Paternal Uncle    Heart attack Maternal Grandmother    Heart attack Maternal Grandfather    Heart attack Paternal Grandmother    Heart attack Paternal Grandfather    Throat cancer Brother        smoker and drinker   Heart attack Brother    Esophageal cancer Brother    Cervical cancer Daughter    Asthma Granddaughter    Colon cancer Neg Hx    Pancreatic cancer Neg Hx    Rectal cancer Neg Hx    Stomach cancer Neg Hx     Past Medical History:  Diagnosis Date   Arthritis    Astigmatism    Family history of breast cancer    Hyperlipidemia    Hypertension    Leukocytosis    LFTs abnormal    Obstructive sleep apnea    Presbyopia    Prostate cancer     Past Surgical History:  Procedure Laterality Date   APPENDECTOMY     LUMBAR DISC SURGERY     partial small bowel resection     TONSILLECTOMY      Social History:  reports that he has never smoked. He has never used smokeless tobacco. He reports that he does not drink  alcohol and does not use drugs.  Allergies: No Known Allergies  Medications Prior to Admission  Medication Sig Dispense Refill   Calcium Carb-Cholecalciferol (CALCIUM/VITAMIN D) 600-400 MG-UNIT TABS Take 1 tablet by mouth daily.     citalopram (CELEXA) 40 MG tablet Take 40 mg by mouth daily. 1/2 tab daily     Cyanocobalamin (B-12 COMPLIANCE INJECTION IJ) Inject as directed.     ferrous sulfate 325 (65 FE) MG tablet Take 325 mg by mouth daily with breakfast. mwf     fluticasone (FLONASE) 50 MCG/ACT nasal spray Place 2 sprays into both nostrils daily.     furosemide (LASIX) 20 MG tablet Take 20 mg by mouth daily.     gabapentin (NEURONTIN) 300 MG capsule Take 1 capsule by mouth at bedtime.     ibuprofen (ADVIL) 200 MG tablet Take 200 mg by mouth every 6 (six) hours as needed.     Leuprolide Acetate, 3 Month, (LUPRON DEPOT, 58-MONTH, IM) Inject into the muscle every 3 (three) months.     loperamide (IMODIUM) 2 MG capsule Take 2 mg by mouth as needed.  losartan (COZAAR) 100 MG tablet Take 100 mg by mouth daily.     lovastatin (MEVACOR) 20 MG tablet Take 20 mg by mouth at bedtime.     meloxicam (MOBIC) 7.5 MG tablet Take 7.5 mg by mouth daily.     metoprolol succinate (TOPROL-XL) 50 MG 24 hr tablet Take 1 tablet (50 mg total) by mouth daily. Take with or immediately following a meal. 90 tablet 2   mometasone (ASMANEX 60 METERED DOSES) 220 MCG/INH inhaler Inhale 2 puffs into the lungs daily. 2 Inhaler 0   NON FORMULARY Immune support OTC vitamin c 1000 mg daily     tamsulosin (FLOMAX) 0.4 MG CAPS capsule Take 0.8 mg by mouth.      traMADol (ULTRAM) 50 MG tablet Take 50 mg by mouth every 6 (six) hours as needed.     VENTOLIN HFA 108 (90 Base) MCG/ACT inhaler        Physical Exam: Blood pressure (!) 145/58, pulse 65, temperature 98.1 F (36.7 C), temperature source Oral, resp. rate 16, height  (1.626 m), weight 85.7 kg, SpO2 97 %. General: pleasant, WD, WN white male who is laying in  bed in NAD HEENT: head is normocephalic, atraumatic.  Sclera are noninjected.  PERRL.  Ears and nose without any masses or lesions.  Mouth is pink and moist Heart: regular, rate, and rhythm.  Normal s1,s2. No obvious murmurs, gallops, or rubs noted.  Palpable radial and pedal pulses bilaterally Lungs: CTAB, no wheezes, rhonchi, or rales noted.  Respiratory effort nonlabored Abd: soft, mild central tenderness, mild bloating, +BS, no masses, hernias, or organomegaly.  Midline scar present MS: all 4 extremities are symmetrical with no cyanosis, clubbing, or edema. Psych: A&Ox3 with an appropriate affect.   Results for orders placed or performed during the hospital encounter of 08/02/22 (from the past 48 hour(s))  Lipase, blood     Status: Abnormal   Collection Time: 08/02/22  3:42 PM  Result Value Ref Range   Lipase 10 (L) 11 - 51 U/L    Comment: Performed at [facility], [address] [place], [place] [address]  Comprehensive metabolic panel     Status: Abnormal   Collection Time: 08/02/22  3:42 PM  Result Value Ref Range   Sodium 137 135 - 145 mmol/L   Potassium 4.3 3.5 - 5.1 mmol/L   Chloride 96 (L) 98 - 111 mmol/L   CO2 29 22 - 32 mmol/L   Glucose, Bld 151 (H) 70 - 99 mg/dL    Comment: Glucose reference range applies only to samples taken after fasting for at least 8 hours.   BUN 17 8 - 23 mg/dL   Creatinine, Ser 6.04 0.61 - 1.24 mg/dL   Calcium 9.9 8.9 - 54.0 mg/dL   Total Protein 7.4 6.5 - 8.1 g/dL   Albumin 4.2 3.5 - 5.0 g/dL   AST 11 (L) 15 - 41 U/L   ALT 8 0 - 44 U/L   Alkaline Phosphatase 55 38 - 126 U/L   Total Bilirubin 0.7 0.3 - 1.2 mg/dL   GFR, Estimated >98 >11 mL/min    Comment: (NOTE) Calculated using the CKD-EPI Creatinine Equation (2021)    Anion gap 12 5 - 15    Comment: Performed at [facility], [address] [place], [place] [address]  CBC     Status: Abnormal   Collection Time: 08/02/22  3:42 PM  Result Value  Ref Range   WBC 11.2 (H) 4.0 - 10.5  K/uL   RBC 4.96 4.22 - 5.81 MIL/uL   Hemoglobin 14.1 13.0 - 17.0 g/dL   HCT 16.1 09.6 - 04.5 %   MCV 86.3 80.0 - 100.0 fL   MCH 28.4 26.0 - 34.0 pg   MCHC 32.9 30.0 - 36.0 g/dL   RDW 40.9 81.1 - 91.4 %   Platelets 340 150 - 400 K/uL   nRBC 0.0 0.0 - 0.2 %    Comment: Performed at [facility], [address], [place], [place] [address]  Lactic acid, plasma     Status: None   Collection Time: 08/02/22  5:42 PM  Result Value Ref Range   Lactic Acid, Venous 1.3 0.5 - 1.9 mmol/L    Comment: Performed at [facility], [address], [place], [place] [address]  Urinalysis, Routine w reflex microscopic -Urine, Clean Catch     Status: Abnormal   Collection Time: 08/02/22  9:51 PM  Result Value Ref Range   Color, Urine YELLOW YELLOW   APPearance CLEAR CLEAR   Specific Gravity, Urine 1.038 (H) 1.005 - 1.030   pH 6.0 5.0 - 8.0   Glucose, UA NEGATIVE NEGATIVE mg/dL   Hgb urine dipstick NEGATIVE NEGATIVE   Bilirubin Urine NEGATIVE NEGATIVE   Ketones, ur NEGATIVE NEGATIVE mg/dL   Protein, ur NEGATIVE NEGATIVE mg/dL   Nitrite NEGATIVE NEGATIVE   Leukocytes,Ua NEGATIVE NEGATIVE    Comment: Performed at [facility], [address] [place], [place] [address]  Basic metabolic panel     Status: Abnormal   Collection Time: 08/03/22  4:48 AM  Result Value Ref Range   Sodium 137 135 - 145 mmol/L   Potassium 4.9 3.5 - 5.1 mmol/L   Chloride 100 98 - 111 mmol/L   CO2 28 22 - 32 mmol/L   Glucose, Bld 120 (H) 70 - 99 mg/dL    Comment: Glucose reference range applies only to samples taken after fasting for at least 8 hours.   BUN 18 8 - 23 mg/dL   Creatinine, Ser 4.69 0.61 - 1.24 mg/dL   Calcium 8.8 (L) 8.9 - 10.3 mg/dL   GFR, Estimated >62 >95 mL/min    Comment: (NOTE) Calculated using the CKD-EPI Creatinine Equation (2021)    Anion gap 9 5 - 15    Comment: Performed at [facility], [address], [place], [place] [address]  Magnesium     Status: None   Collection Time: 08/03/22  4:48 AM  Result Value Ref Range   Magnesium 2.4 1.7 - 2.4 mg/dL    Comment: Performed at [facility], [address] [place], [place] [address]  CBC     Status: Abnormal   Collection Time: 08/03/22  4:48 AM  Result Value Ref Range   WBC 10.0 4.0 - 10.5 K/uL   RBC 4.53 4.22 - 5.81 MIL/uL   Hemoglobin 12.9 (L) 13.0 - 17.0 g/dL   HCT 02.7 25.3 - 66.4 %   MCV 87.6 80.0 - 100.0 fL   MCH 28.5 26.0 - 34.0 pg   MCHC 32.5 30.0 - 36.0 g/dL   RDW 40.3 47.4 - 25.9 %   Platelets 287 150 - 400 K/uL   nRBC 0.0 0.0 - 0.2 %    Comment: Performed at [facility], [address], [place], [place] [address]  Phosphorus     Status: Abnormal   Collection Time: 08/03/22  4:48 AM  Result Value Ref Range   Phosphorus 4.7 (  H) 2.5 - 4.6 mg/dL    Comment: Performed at [facility], [address], [place], [place] [address]   CT ABDOMEN PELVIS W CONTRAST  Result Date: 08/02/2022 CLINICAL DATA:  Abdominal pain.  No bowel movement for 2 days EXAM: CT ABDOMEN AND PELVIS WITH CONTRAST TECHNIQUE: Multidetector CT imaging of the abdomen and pelvis was performed using the standard protocol following bolus administration of intravenous contrast. RADIATION DOSE REDUCTION: This exam was performed according to the departmental dose-optimization program which includes automated exposure control, adjustment of the mA and/or kV according to patient size and/or use of iterative reconstruction technique. CONTRAST:  80mL OMNIPAQUE IOHEXOL 300 MG/ML  SOLN COMPARISON:  None Available. FINDINGS: Lower chest: No acute abnormality. Hepatobiliary: Patent portal vein. No space-occupying liver lesion. Distended gallbladder. Pancreas: Unremarkable. No pancreatic ductal dilatation or surrounding inflammatory changes. Spleen: Small splenule. Preserved enhancement to the  normal-sized spleen. Adrenals/Urinary Tract: Right adrenal gland is preserved. The left is slightly thickened, nonspecific. No enhancing renal mass or collecting system dilatation. The ureters have normal course and caliber extending down to the bladder. Slight bladder wall thickening with the bladder is underdistended. Stomach/Bowel: Stomach is nondilated. Normal caliber duodenal. There are some dilated fluid-filled loops mid to distal jejunum and proximal ileum. Focal transition point in the central pelvic mesentery right of midline as seen on coronal image 56, axial image 56. Developing or partial obstruction. No pneumatosis. Colon is decompressed. The appendix is not seen in the right lower quadrant. No pericecal stranding or fluid Vascular/Lymphatic: . normal caliber aorta and IVC with scattered vascular calcifications. Calcifications are also seen along branch vessels including the celiac axis, SMA and IMA. Reproductive: Enlarged prostate with mass effect along the base of the bladder. Other: Small fat containing umbilical hernias. Surgical changes along the anterior pelvic wall in the midline. Small fat containing umbilical hernia. Musculoskeletal: Scattered degenerative changes of the spine. Disc bulging at L4-5 with some stenosis. Degenerative changes along the pelvis as well with bridging osteophytes along the sacroiliac joints. IMPRESSION: Multiple dilated loops of small bowel approaching 3.7 cm with caliber change in the central pelvic mesentery. Developing or partial obstruction. Adjacent stranding and trace ascites. No free air or pneumatosis at this time. Enlarged prostate with mass effect along the base of the bladder. Slight wall thickening of the bladder and trabeculation. Please correlate with patient's PSA. Surgical changes along the anterior pelvic wall. Significant vascular calcifications noted including atherosclerotic changes along the mesenteric vessels. Electronically Signed   By: Karen Kays M.D.   On: 08/02/2022 18:11      Assessment/Plan pSBO The patient has been seen, examined, chart, labs, vitals, and imaging personally reviewed.  He appears to likely have a psbo as he is already starting to move his bowels.  We will plan to proceed with the SBO protocol without and NGT as he has had no nausea or vomiting.  I suspect he will likely improve with just conservative management and not require any acute operations.  His daughter was on the phone and son was present in the room.  We all discussed this plan and everyone was in agreement.  He may mobilize as able.  We will continued to follow.  May have ice chips and few sips of liquids from the floor for now.   FEN - NPO x ice and few sips, SBO protocol, IVFs VTE - heparin ID - none needed  Prostate cancer, s/p radiation HTN HLD  I reviewed hospitalist notes, last  24 h vitals and pain scores, last 48 h intake and output, last 24 h labs and trends, and last 24 h imaging results.  Letha Cape, [facility] 08/03/2022, 10:06 AM Please see Amion for pager number during day hours 7:00am-4:30pm or 7:00am -11:30am on weekends

## 2022-08-04 DIAGNOSIS — K56600 Partial intestinal obstruction, unspecified as to cause: Secondary | ICD-10-CM | POA: Diagnosis not present

## 2022-08-04 LAB — CBC
HCT: 39.3 % (ref 39.0–52.0)
Hemoglobin: 12.8 g/dL — ABNORMAL LOW (ref 13.0–17.0)
MCH: 29.2 pg (ref 26.0–34.0)
MCHC: 32.6 g/dL (ref 30.0–36.0)
MCV: 89.5 fL (ref 80.0–100.0)
Platelets: 265 10*3/uL (ref 150–400)
RBC: 4.39 MIL/uL (ref 4.22–5.81)
RDW: 13.9 % (ref 11.5–15.5)
WBC: 9.3 10*3/uL (ref 4.0–10.5)
nRBC: 0 % (ref 0.0–0.2)

## 2022-08-04 LAB — BASIC METABOLIC PANEL
Anion gap: 9 (ref 5–15)
BUN: 20 mg/dL (ref 8–23)
CO2: 27 mmol/L (ref 22–32)
Calcium: 8.6 mg/dL — ABNORMAL LOW (ref 8.9–10.3)
Chloride: 104 mmol/L (ref 98–111)
Creatinine, Ser: 1.05 mg/dL (ref 0.61–1.24)
GFR, Estimated: >60 mL/min (ref >60)
Glucose, Bld: 97 mg/dL (ref 70–99)
Potassium: 4.9 mmol/L (ref 3.5–5.1)
Sodium: 140 mmol/L (ref 135–145)

## 2022-08-04 MED ORDER — LOSARTAN POTASSIUM 50 MG PO TABS
100.0000 mg | ORAL_TABLET | Freq: Every day | ORAL | Status: DC
  Administered 2022-08-04 – 2022-08-05 (×2): 100 mg via ORAL
  Filled 2022-08-04 (×2): qty 2

## 2022-08-04 MED ORDER — CITALOPRAM HYDROBROMIDE 20 MG PO TABS
20.0000 mg | ORAL_TABLET | Freq: Every day | ORAL | Status: DC
  Administered 2022-08-04 – 2022-08-05 (×2): 20 mg via ORAL
  Filled 2022-08-04 (×2): qty 1

## 2022-08-04 MED ORDER — TAMSULOSIN HCL 0.4 MG PO CAPS
0.8000 mg | ORAL_CAPSULE | Freq: Every day | ORAL | Status: DC
  Administered 2022-08-04: 0.8 mg via ORAL
  Filled 2022-08-04: qty 2

## 2022-08-04 MED ORDER — PRAVASTATIN SODIUM 20 MG PO TABS
10.0000 mg | ORAL_TABLET | Freq: Every day | ORAL | Status: DC
  Administered 2022-08-04: 10 mg via ORAL
  Filled 2022-08-04: qty 1

## 2022-08-04 MED ORDER — LORATADINE 10 MG PO TABS
10.0000 mg | ORAL_TABLET | Freq: Every day | ORAL | Status: DC
  Administered 2022-08-04 – 2022-08-05 (×2): 10 mg via ORAL
  Filled 2022-08-04 (×2): qty 1

## 2022-08-04 MED ORDER — FLUTICASONE PROPIONATE 50 MCG/ACT NA SUSP
2.0000 | Freq: Every day | NASAL | Status: DC
  Administered 2022-08-04: 2 via NASAL
  Filled 2022-08-04: qty 16

## 2022-08-04 MED ORDER — GABAPENTIN 100 MG PO CAPS
300.0000 mg | ORAL_CAPSULE | Freq: Every day | ORAL | Status: DC
  Administered 2022-08-04: 300 mg via ORAL
  Filled 2022-08-04: qty 3

## 2022-08-04 NOTE — Progress Notes (Signed)
Mobility Specialist - Progress Note   08/04/22 0927  Mobility  Activity Ambulated independently in hallway  Level of Assistance Independent  Assistive Device None  Distance Ambulated (ft) 500 ft  Activity Response Tolerated well  Mobility Referral Yes  $Mobility charge 1 Mobility   Pt received in recliner after eating breakfast and agreeable to mobility. No complaints during session. Pt states he feels "much better" today. Pt to recliner after session with all needs met & son in room.     [facility]

## 2022-08-04 NOTE — Plan of Care (Signed)

## 2022-08-04 NOTE — TOC CM/SW Note (Signed)
  Transition of Care [facility]) Screening Note   Patient Details  Name: Gregory Ewing Date of Birth: 15-Sep-1945   Transition of Care [facility]) CM/SW Contact:    Amada Jupiter, LCSW Phone Number: 08/04/2022, 10:50 AM    Transition of Care Department [facility]) has reviewed patient and no TOC needs have been identified at this time. We will continue to monitor patient advancement through interdisciplinary progression rounds. If new patient transition needs arise, please place a TOC consult.

## 2022-08-04 NOTE — Progress Notes (Signed)
      Chief Complaint/Subjective: He is feeling much better, he has had multiple bowel movements  Objective: Vital signs in last 24 hours: Temp:  [97.5 F (36.4 C)-98.5 F (36.9 C)] 97.7 F (36.5 C) (04/23 1213) Pulse Rate:  [60-69] 62 (04/23 1213) Resp:  [16-19] 19 (04/23 1213) BP: (144-177)/(50-73) 165/63 (04/23 1213) SpO2:  [97 %-100 %] 100 % (04/23 1213) Last BM Date : 08/04/22 Intake/Output from previous day: 04/22 0701 - 04/23 0700 In: 180 [P.O.:180] Out: 450 [Urine:450]  PE: Gen: NAd Resp: nonlabored Card: RRR Abd: soft, NT, ND  Lab Results:  Recent Labs    08/03/22 0448 08/04/22 0525  WBC 10.0 9.3  HGB 12.9* 12.8*  HCT 39.7 39.3  PLT 287 265   Recent Labs    08/03/22 0448 08/04/22 0525  NA 137 140  K 4.9 4.9  CL 100 104  CO2 28 27  GLUCOSE 120* 97  BUN 18 20  CREATININE 1.20 1.05  CALCIUM 8.8* 8.6*   No results for input(s): "LABPROT", "INR" in the last 72 hours.    Component Value Date/Time   NA 140 08/04/2022 0525   K 4.9 08/04/2022 0525   CL 104 08/04/2022 0525   CO2 27 08/04/2022 0525   GLUCOSE 97 08/04/2022 0525   BUN 20 08/04/2022 0525   CREATININE 1.05 08/04/2022 0525   CALCIUM 8.6 (L) 08/04/2022 0525   PROT 7.4 08/02/2022 1542   ALBUMIN 4.2 08/02/2022 1542   AST 11 (L) 08/02/2022 1542   ALT 8 08/02/2022 1542   ALKPHOS 55 08/02/2022 1542   BILITOT 0.7 08/02/2022 1542   GFRNONAA >60 08/04/2022 0525    Assessment/Plan 77 yo male with resolving bowel obstruction FEN - soft diet VTE - lovenox ID - no issues Disposition - home today vs tomrrow   LOS: 2 days   I reviewed last 24 h vitals and pain scores, last 48 h intake and output, last 24 h labs and trends, and last 24 h imaging results.  This care required moderate level of medical decision making.   De Blanch [facility] Surgery at [facility] 08/04/2022, 1:40 PM Please see Amion for pager number during day hours 7:00am-4:30pm or 7:00am -11:30am on  weekends

## 2022-08-04 NOTE — Progress Notes (Signed)
PROGRESS NOTE    Gregory Ewing  WUJ:[id number] DOB: September 16, 1945 DOA: 08/02/2022 PCP: Concha Pyo, MD   Brief Narrative:  Gregory Ewing is a 77 y.o. male with medical history significant for prostate cancer status post radiation, hypertension, hyperlipidemia, OSA, and COPD who presents to the emergency department with abdominal pain and belching.   Reports 2 to 3 days of abdominal pain that has been severe at times.  Last bowel movement was 2 days ago.  He was having difficulty passing flatus but did pass a minimal amount earlier today and again shortly after arrival to [facility].  He denies vomiting. He has been drinking water the past 2 days, had a cup of coffee, and was able to keep his medications down this morning, but has not attempted to eat.     He has history of 2 abdominal surgeries in the 1970's and states he had 18 inches of small bowel removed.    [facility] ED Course: Upon arrival to the ED, patient is found to be afebrile and saturating well on room air with normal heart rate and elevated blood pressure.  ED workup notable for normal lactic acid and CT findings that include loops of dilated small bowel concerning for developing or partial obstruction.   Surgery (Dr. Donell Beers) was consulted by the ED physician, patient was treated with Dilaudid, and he was transferred to [facility] for [facility].    Assessment & Plan:   Principal Problem:   Partial small bowel obstruction Active Problems:   Hypertension   Chronic obstructive pulmonary disease  #1 partial small bowel obstruction-patient has had few bowel movements today.  Followed by surgery  SBO protocol 4/22-non obstructive bowel gas pattern CT abdomen multiple dilated loops of small bowel approaching 3.7 cm partial obstruction trace ascites enlarged prostate Dc ivf Ambulate  He was started on a soft diet 4/ 23 /2024 Continue to hold Lasix  #2 history of essential hypertension on Cozaar  metoprolol  This has been restarted Lasix on hold  #3 COPD stable   #4 depression restart Celexa   #5 BPH on Flomax  #6 hyperlipidemia on Mevacor  Estimated body mass index is 32.44 kg/m as calculated from the following:   Height as of this encounter:  (1.626 m).   Weight as of this encounter: 85.7 kg.  DVT prophylaxis: lovenox Code Status: full Family Communication: Discussed with daughter Juliette Alcide disposition Plan:  Status is: Inpatient Remains inpatient appropriate because: sbo   Consultants:  surgery  Procedures:none Antimicrobials: none  Subjective:  Patient reports having bowel movements denies nausea vomiting Son by the bedside Objective: Vitals:   08/04/22 0025 08/04/22 0613 08/04/22 0854 08/04/22 1213  BP: (!) 155/61 (!) 177/73  (!) 165/63  Pulse: 69 66  62  Resp: Temp: 98.5 F (36.9 C) 97.9 F (36.6 C)  97.7 F (36.5 C)  TempSrc: Oral   Oral  SpO2: 100% 100% 97% 100%  Weight:      Height:        Intake/Output Summary (Last 24 hours) at 08/04/2022 1250 Last data filed at 08/04/2022 1000 Gross per 24 hour  Intake 780 ml  Output --  Net 780 ml    Filed Weights   08/02/22 1533  Weight: 85.7 kg    Examination:  General exam: Appears calm and comfortable  Respiratory system: Clear to auscultation. Respiratory effort normal. Cardiovascular system: S1 & S2 heard, RRR. No JVD, murmurs, rubs, gallops or  clicks. No pedal edema. Gastrointestinal system: Abdomen is distended, soft and tender. No organomegaly or masses felt. Normal bowel sounds heard.old surgical scar Central nervous system: Alert and oriented. No focal neurological deficits. Extremities: Symmetric 5 x 5 power.  Data Reviewed: I have personally reviewed following labs and imaging studies  CBC: Recent Labs  Lab 08/02/22 1542 08/03/22 0448 08/04/22 0525  WBC 11.2* 10.0 9.3  HGB 14.1 12.9* 12.8*  HCT 42.8 39.7 39.3  MCV 86.3 87.6 89.5  PLT 340 287 265    Basic  Metabolic Panel: Recent Labs  Lab 08/02/22 1542 08/03/22 0448 08/04/22 0525  NA 137 137 140  K 4.3 4.9 4.9  CL 96* 100 104  CO2 GLUCOSE 151* 120* 97  BUN CREATININE 1.21 1.20 1.05  CALCIUM 9.9 8.8* 8.6*  MG  --  2.4  --   PHOS  --  4.7*  --     GFR: Estimated Creatinine Clearance: 60 mL/min (by C-G formula based on SCr of 1.05 mg/dL). Liver Function Tests: Recent Labs  Lab 08/02/22 1542  AST 11*  ALT 8  ALKPHOS 55  BILITOT 0.7  PROT 7.4  ALBUMIN 4.2    Recent Labs  Lab 08/02/22 1542  LIPASE 10*    No results for input(s): "AMMONIA" in the last 168 hours. Coagulation Profile: No results for input(s): "INR", "PROTIME" in the last 168 hours. Cardiac Enzymes: No results for input(s): "CKTOTAL", "CKMB", "CKMBINDEX", "TROPONINI" in the last 168 hours. BNP (last 3 results) No results for input(s): "PROBNP" in the last 8760 hours. HbA1C: No results for input(s): "HGBA1C" in the last 72 hours. CBG: No results for input(s): "GLUCAP" in the last 168 hours. Lipid Profile: No results for input(s): "CHOL", "HDL", "LDLCALC", "TRIG", "CHOLHDL", "LDLDIRECT" in the last 72 hours. Thyroid Function Tests: No results for input(s): "TSH", "T4TOTAL", "FREET4", "T3FREE", "THYROIDAB" in the last 72 hours. Anemia Panel: No results for input(s): "VITAMINB12", "FOLATE", "FERRITIN", "TIBC", "IRON", "RETICCTPCT" in the last 72 hours. Sepsis Labs: Recent Labs  Lab 08/02/22 1742  LATICACIDVEN 1.3     No results found for this or any previous visit (from the past 240 hour(s)).       Radiology Studies: DG Abd Portable 1V-Small Bowel Obstruction Protocol-initial, 8 hr delay  Result Date: 08/03/2022 CLINICAL DATA:  Small-bowel obstruction protocol, 8 hour delayed image. EXAM: PORTABLE ABDOMEN - 1 VIEW COMPARISON:  08/02/2022. FINDINGS: The bowel gas pattern is normal. Retained contrast is present in the colon. Suture material is present over the low anterior  pelvis. No radio-opaque calculi or other significant radiographic abnormality are seen. IMPRESSION: Nonobstructive bowel-gas pattern. Electronically Signed   By: Thornell Sartorius M.D.   On: 08/03/2022 20:12   CT ABDOMEN PELVIS W CONTRAST  Result Date: 08/02/2022 CLINICAL DATA:  Abdominal pain.  No bowel movement for 2 days EXAM: CT ABDOMEN AND PELVIS WITH CONTRAST TECHNIQUE: Multidetector CT imaging of the abdomen and pelvis was performed using the standard protocol following bolus administration of intravenous contrast. RADIATION DOSE REDUCTION: This exam was performed according to the departmental dose-optimization program which includes automated exposure control, adjustment of the mA and/or kV according to patient size and/or use of iterative reconstruction technique. CONTRAST:  80mL OMNIPAQUE IOHEXOL 300 MG/ML  SOLN COMPARISON:  None Available. FINDINGS: Lower chest: No acute abnormality. Hepatobiliary: Patent portal vein. No space-occupying liver lesion. Distended gallbladder. Pancreas: Unremarkable. No pancreatic ductal dilatation or surrounding inflammatory changes. Spleen: Small splenule. Preserved enhancement to  the normal-sized spleen. Adrenals/Urinary Tract: Right adrenal gland is preserved. The left is slightly thickened, nonspecific. No enhancing renal mass or collecting system dilatation. The ureters have normal course and caliber extending down to the bladder. Slight bladder wall thickening with the bladder is underdistended. Stomach/Bowel: Stomach is nondilated. Normal caliber duodenal. There are some dilated fluid-filled loops mid to distal jejunum and proximal ileum. Focal transition point in the central pelvic mesentery right of midline as seen on coronal image 56, axial image 56. Developing or partial obstruction. No pneumatosis. Colon is decompressed. The appendix is not seen in the right lower quadrant. No pericecal stranding or fluid Vascular/Lymphatic: . normal caliber aorta and IVC with  scattered vascular calcifications. Calcifications are also seen along branch vessels including the celiac axis, SMA and IMA. Reproductive: Enlarged prostate with mass effect along the base of the bladder. Other: Small fat containing umbilical hernias. Surgical changes along the anterior pelvic wall in the midline. Small fat containing umbilical hernia. Musculoskeletal: Scattered degenerative changes of the spine. Disc bulging at L4-5 with some stenosis. Degenerative changes along the pelvis as well with bridging osteophytes along the sacroiliac joints. IMPRESSION: Multiple dilated loops of small bowel approaching 3.7 cm with caliber change in the central pelvic mesentery. Developing or partial obstruction. Adjacent stranding and trace ascites. No free air or pneumatosis at this time. Enlarged prostate with mass effect along the base of the bladder. Slight wall thickening of the bladder and trabeculation. Please correlate with patient's PSA. Surgical changes along the anterior pelvic wall. Significant vascular calcifications noted including atherosclerotic changes along the mesenteric vessels. Electronically Signed   By: Karen Kays M.D.   On: 08/02/2022 18:11        Scheduled Meds:  budesonide  0.5 mg Nebulization BID   heparin  5,000 Units Subcutaneous Q8H   Continuous Infusions:     LOS: 2 days   Time spent: 35 minutes  Alwyn Ren, MD 08/04/2022, 12:50 PM

## 2022-08-04 NOTE — Progress Notes (Signed)
Mobility Specialist - Progress Note   08/04/22 1347  Mobility  Activity Ambulated independently in hallway  Level of Assistance Independent  Assistive Device None  Distance Ambulated (ft) 500 ft  Activity Response Tolerated well  Mobility Referral Yes  $Mobility charge 1 Mobility   Pt received in recliner and agreeable to mobility. No complaints during session. Pt to recliner after session with all needs met.     [facility]

## 2022-08-05 DIAGNOSIS — K56600 Partial intestinal obstruction, unspecified as to cause: Secondary | ICD-10-CM | POA: Diagnosis not present

## 2022-08-05 LAB — BASIC METABOLIC PANEL
Anion gap: 7 (ref 5–15)
BUN: 17 mg/dL (ref 8–23)
CO2: 27 mmol/L (ref 22–32)
Calcium: 9 mg/dL (ref 8.9–10.3)
Chloride: 104 mmol/L (ref 98–111)
Creatinine, Ser: 1.02 mg/dL (ref 0.61–1.24)
GFR, Estimated: >60 mL/min (ref >60)
Glucose, Bld: 110 mg/dL — ABNORMAL HIGH (ref 70–99)
Potassium: 4.7 mmol/L (ref 3.5–5.1)
Sodium: 138 mmol/L (ref 135–145)

## 2022-08-05 LAB — CBC
HCT: 40.7 % (ref 39.0–52.0)
Hemoglobin: 13.2 g/dL (ref 13.0–17.0)
MCH: 28.7 pg (ref 26.0–34.0)
MCHC: 32.4 g/dL (ref 30.0–36.0)
MCV: 88.5 fL (ref 80.0–100.0)
Platelets: 287 10*3/uL (ref 150–400)
RBC: 4.6 MIL/uL (ref 4.22–5.81)
RDW: 13.8 % (ref 11.5–15.5)
WBC: 10 10*3/uL (ref 4.0–10.5)
nRBC: 0 % (ref 0.0–0.2)

## 2022-08-05 NOTE — Progress Notes (Signed)
Pt was discharged home today. Instructions were reviewed with patient, and questions were answered. Pt was taken to main entrance via wheelchair by NT.  

## 2022-08-05 NOTE — Discharge Summary (Signed)
Physician Discharge Summary   Patient: Gregory Ewing MRN: [id number] DOB: [birth year]  Admit date:     08/02/2022  Discharge date: 08/05/22  Discharge Physician: Briant Cedar   PCP: Concha Pyo, MD   Recommendations at discharge:   Follow-up with PCP in 1 week  Discharge Diagnoses: Principal Problem:   Partial small bowel obstruction Active Problems:   Hypertension   Chronic obstructive pulmonary disease    Hospital Course: Gregory Ewing is a 77 y.o. male with medical history significant for prostate cancer status post radiation, hypertension, hyperlipidemia, OSA, and COPD who presents to the emergency department with abdominal pain and belching. Reports 2 to 3 days of abdominal pain that has been severe at times.  Last bowel movement was 2 days ago. History of 2 abdominal surgeries in the 1970's and states he had 18 inches of small bowel removed. CT findings that include loops of dilated small bowel concerning for developing or partial obstruction.  General surgery consulted.  Patient admitted for further management.    Today, patient denies any new complaints, denies any further abdominal pain, nausea/vomiting, fever/chills, having normal BM and passing gas.  Discussed with patient's daughter upon discharge, all questions answered.  Patient to follow-up with his PCP at the [place] in 1 week.    Assessment and Plan:  Partial small bowel obstruction CT abdomen multiple dilated loops of small bowel approaching 3.7 cm partial obstruction, trace ascites, enlarged prostate Currently denies any abdominal pain, having BMs and passing gas.  Tolerating diet  General Surgery consulted, appreciate recs Follow-up with PCP in 1 week  History of essential hypertension Continue Cozaar, metoprolol, lasix   COPD  Stable Continue home regimen  Hyperlipidemia Continue Mevacor  BPH Continue Flomax   Depression  Continue Celexa    Obesity Lifestyle modification  advised       Consultants: General Surgery Procedures performed: None Disposition: Home Diet recommendation:  Soft diet, advance as tolerated    DISCHARGE MEDICATION: Allergies as of 08/05/2022   No Known Allergies      Medication List     STOP taking these medications    LUPRON DEPOT (89-MONTH) IM   metoprolol succinate 50 MG 24 hr tablet Commonly known as: TOPROL-XL       TAKE these medications    B-12 Compliance Injection 1000 MCG/ML Kit Generic drug: Cyanocobalamin Inject 1,000 mcg as directed every 30 (thirty) days.   Calcium/Vitamin D 600-400 MG-UNIT Tabs Take 1 tablet by mouth daily.   cetirizine 10 MG tablet Commonly known as: ZYRTEC Take 10 mg by mouth at bedtime.   citalopram 40 MG tablet Commonly known as: CELEXA Take 20 mg by mouth daily.   ferrous sulfate 325 (65 FE) MG tablet Take 325 mg by mouth 3 (three) times a week. Monday, Wednesday, Friday   fluticasone 50 MCG/ACT nasal spray Commonly known as: FLONASE Place 2 sprays into both nostrils at bedtime.   furosemide 20 MG tablet Commonly known as: LASIX Take 20 mg by mouth daily.   gabapentin 300 MG capsule Commonly known as: NEURONTIN Take 300 mg by mouth at bedtime.   loperamide 2 MG capsule Commonly known as: IMODIUM Take 2 mg by mouth daily as needed for diarrhea or loose stools.   losartan 100 MG tablet Commonly known as: COZAAR Take 100 mg by mouth daily.   lovastatin 20 MG tablet Commonly known as: MEVACOR Take 20 mg by mouth at bedtime.   mometasone 220 MCG/INH inhaler Commonly known as: Asmanex (  60 Metered Doses) Inhale 2 puffs into the lungs daily. What changed:  when to take this reasons to take this   NON FORMULARY Immune support OTC vitamin c 1000 mg daily   tamsulosin 0.4 MG Caps capsule Commonly known as: FLOMAX Take 0.8 mg by mouth at bedtime.   traMADol 50 MG tablet Commonly known as: ULTRAM Take 50 mg by mouth as needed for moderate pain.    Ventolin HFA 108 (90 Base) MCG/ACT inhaler Generic drug: albuterol        Follow-up Information     Concha Pyo, MD. Schedule an appointment as soon as possible for a visit in 1 week(s).   Specialty: Family Medicine Contact information: [address] [place] [address] [phone]                Discharge Exam: Ceasar Mons Weights   08/02/22 1533 08/05/22 0542  Weight: 85.7 kg 80.4 kg   General: NAD  Cardiovascular: S1, S2 present Respiratory: CTAB Abdomen: Soft, nontender, nondistended, bowel sounds present, noted lower midline scar in the abdomen Musculoskeletal: Trace bilateral pedal edema noted, venous stasis changes noted bilaterally Skin: As above Psychiatry: Normal mood   Condition at discharge: stable  The results of significant diagnostics from this hospitalization (including imaging, microbiology, ancillary and laboratory) are listed below for reference.   Imaging Studies: DG Abd Portable 1V-Small Bowel Obstruction Protocol-initial, 8 hr delay  Result Date: 08/03/2022 CLINICAL DATA:  Small-bowel obstruction protocol, 8 hour delayed image. EXAM: PORTABLE ABDOMEN - 1 VIEW COMPARISON:  08/02/2022. FINDINGS: The bowel gas pattern is normal. Retained contrast is present in the colon. Suture material is present over the low anterior pelvis. No radio-opaque calculi or other significant radiographic abnormality are seen. IMPRESSION: Nonobstructive bowel-gas pattern. Electronically Signed   By: Thornell Sartorius M.D.   On: 08/03/2022 20:12   CT ABDOMEN PELVIS W CONTRAST  Result Date: 08/02/2022 CLINICAL DATA:  Abdominal pain.  No bowel movement for 2 days EXAM: CT ABDOMEN AND PELVIS WITH CONTRAST TECHNIQUE: Multidetector CT imaging of the abdomen and pelvis was performed using the standard protocol following bolus administration of intravenous contrast. RADIATION DOSE REDUCTION: This exam was performed according to the departmental dose-optimization program which  includes automated exposure control, adjustment of the mA and/or kV according to patient size and/or use of iterative reconstruction technique. CONTRAST:  80mL OMNIPAQUE IOHEXOL 300 MG/ML  SOLN COMPARISON:  None Available. FINDINGS: Lower chest: No acute abnormality. Hepatobiliary: Patent portal vein. No space-occupying liver lesion. Distended gallbladder. Pancreas: Unremarkable. No pancreatic ductal dilatation or surrounding inflammatory changes. Spleen: Small splenule. Preserved enhancement to the normal-sized spleen. Adrenals/Urinary Tract: Right adrenal gland is preserved. The left is slightly thickened, nonspecific. No enhancing renal mass or collecting system dilatation. The ureters have normal course and caliber extending down to the bladder. Slight bladder wall thickening with the bladder is underdistended. Stomach/Bowel: Stomach is nondilated. Normal caliber duodenal. There are some dilated fluid-filled loops mid to distal jejunum and proximal ileum. Focal transition point in the central pelvic mesentery right of midline as seen on coronal image 56, axial image 56. Developing or partial obstruction. No pneumatosis. Colon is decompressed. The appendix is not seen in the right lower quadrant. No pericecal stranding or fluid Vascular/Lymphatic: . normal caliber aorta and IVC with scattered vascular calcifications. Calcifications are also seen along branch vessels including the celiac axis, SMA and IMA. Reproductive: Enlarged prostate with mass effect along the base of the bladder. Other: Small fat containing umbilical hernias. Surgical changes along  the anterior pelvic wall in the midline. Small fat containing umbilical hernia. Musculoskeletal: Scattered degenerative changes of the spine. Disc bulging at L4-5 with some stenosis. Degenerative changes along the pelvis as well with bridging osteophytes along the sacroiliac joints. IMPRESSION: Multiple dilated loops of small bowel approaching 3.7 cm with caliber  change in the central pelvic mesentery. Developing or partial obstruction. Adjacent stranding and trace ascites. No free air or pneumatosis at this time. Enlarged prostate with mass effect along the base of the bladder. Slight wall thickening of the bladder and trabeculation. Please correlate with patient's PSA. Surgical changes along the anterior pelvic wall. Significant vascular calcifications noted including atherosclerotic changes along the mesenteric vessels. Electronically Signed   By: Karen Kays M.D.   On: 08/02/2022 18:11    Microbiology: No results found for this or any previous visit.  Labs: CBC: Recent Labs  Lab 08/02/22 1542 08/03/22 0448 08/04/22 0525 08/05/22 0453  WBC 11.2* 10.0 9.3 10.0  HGB 14.1 12.9* 12.8* 13.2  HCT 42.8 39.7 39.3 40.7  MCV 86.3 87.6 89.5 88.5  PLT 340 287 265 287   Basic Metabolic Panel: Recent Labs  Lab 08/02/22 1542 08/03/22 0448 08/04/22 0525 08/05/22 0453  NA 137 137 140 138  K 4.3 4.9 4.9 4.7  CL 96* 100 104 104  CO2 29 28 27 27   GLUCOSE 151* 120* 97 110*  BUN 17 18 20 17   CREATININE 1.21 1.20 1.05 1.02  CALCIUM 9.9 8.8* 8.6* 9.0  MG  --  2.4  --   --   PHOS  --  4.7*  --   --    Liver Function Tests: Recent Labs  Lab 08/02/22 1542  AST 11*  ALT 8  ALKPHOS 55  BILITOT 0.7  PROT 7.4  ALBUMIN 4.2   CBG: No results for input(s): "GLUCAP" in the last 168 hours.  Discharge time spent: greater than 30 minutes.  Signed: Briant Cedar, MD [facility] 08/05/2022

## 2022-08-05 NOTE — Plan of Care (Signed)

## 2022-08-05 NOTE — Progress Notes (Signed)
Mobility Specialist - Progress Note   08/05/22 0943  Mobility  Activity Ambulated independently in hallway  Level of Assistance Independent  Assistive Device None  Distance Ambulated (ft) 500 ft  Activity Response Tolerated well  Mobility Referral Yes  $Mobility charge 1 Mobility   Pt received in recliner and agreeable to mobility. No complaints during session. Pt to recliner after session with all needs met.     [facility]

## 2022-12-25 ENCOUNTER — Ambulatory Visit: Payer: No Typology Code available for payment source | Admitting: Cardiology

## 2022-12-25 NOTE — Progress Notes (Deleted)
Follow up visit  Subjective:   Gregory Ewing, male    DOB: 17-Aug-1945, 77 y.o.   MRN: [id number]    *** HPI  No chief complaint on file.   77 y.o. Caucasian male with hypertension, hyperlipidemia, venous insufficiency  ***  ***  Current Outpatient Medications:    Calcium Carb-Cholecalciferol (CALCIUM/VITAMIN D) 600-400 MG-UNIT TABS, Take 1 tablet by mouth daily., Disp: , Rfl:    cetirizine (ZYRTEC) 10 MG tablet, Take 10 mg by mouth at bedtime., Disp: , Rfl:    citalopram (CELEXA) 40 MG tablet, Take 20 mg by mouth daily., Disp: , Rfl:    Cyanocobalamin (B-12 COMPLIANCE INJECTION) 1000 MCG/ML KIT, Inject 1,000 mcg as directed every 30 (thirty) days., Disp: , Rfl:    ferrous sulfate 325 (65 FE) MG tablet, Take 325 mg by mouth 3 (three) times a week. Monday, Wednesday, Friday, Disp: , Rfl:    fluticasone (FLONASE) 50 MCG/ACT nasal spray, Place 2 sprays into both nostrils at bedtime., Disp: , Rfl:    furosemide (LASIX) 20 MG tablet, Take 20 mg by mouth daily., Disp: , Rfl:    gabapentin (NEURONTIN) 300 MG capsule, Take 300 mg by mouth at bedtime., Disp: , Rfl:    loperamide (IMODIUM) 2 MG capsule, Take 2 mg by mouth daily as needed for diarrhea or loose stools., Disp: , Rfl:    losartan (COZAAR) 100 MG tablet, Take 100 mg by mouth daily., Disp: , Rfl:    lovastatin (MEVACOR) 20 MG tablet, Take 20 mg by mouth at bedtime., Disp: , Rfl:    mometasone (ASMANEX 60 METERED DOSES) 220 MCG/INH inhaler, Inhale 2 puffs into the lungs daily. (Patient taking differently: Inhale 2 puffs into the lungs daily as needed (wheezing/SOB).), Disp: 2 Inhaler, Rfl: 0   NON FORMULARY, Immune support OTC vitamin c 1000 mg daily, Disp: , Rfl:    tamsulosin (FLOMAX) 0.4 MG CAPS capsule, Take 0.8 mg by mouth at bedtime., Disp: , Rfl:    traMADol (ULTRAM) 50 MG tablet, Take 50 mg by mouth as needed for moderate pain., Disp: , Rfl:    VENTOLIN HFA 108 (90 Base) MCG/ACT inhaler, , Disp: , Rfl:     Cardiovascular & other pertient studies:  Reviewed external labs and tests, independently interpreted  *** EKG ***/***/202***: ***  Lower Extremity Arterial Duplex 05/21/2022:  Moderate velocity increase at the right proximal posterior tibial artery  suggests >50% stenosis.  No hemodynamically significant stenoses are identified in the left lower  extremity arterial system.  This exam reveals mildly decreased perfusion of the right lower extremity,  noted at the dorsalis pedis and post tibial artery level (ABI 0.95) with  mildly abnormal biphasic waveform and dampened flow in the DP at the level  of the ankle.   This exam reveals normal perfusion of the left lower extremity (ABI 0.98)  with mildly abnormal biphasic waveform pattern at the left ankle.   Echocardiogram 05/21/2022: Normal LV systolic function with visual EF 60-65%. Left ventricle cavity is normal in size. Normal left ventricular wall thickness. Normal global wall motion. Normal diastolic filling pattern, normal LAP. Mild (Grade I) aortic regurgitation. Mild tricuspid regurgitation. No evidence of pulmonary hypertension. No prior study for comparison.   Recent labs: 08/05/2022: Glucose 110, BUN/Cr 17/1.02. EGFR >60. Na/K 138/4.7. Rest of the CMP normal H/H 13/40. MCV 88. Platelets 287   *** ROS      *** There were no vitals filed for this visit.  There is no height  or weight on file to calculate BMI. There were no vitals filed for this visit.  *** Objective:   Physical Exam          Visit diagnoses: No diagnosis found.   No orders of the defined types were placed in this encounter.    Medication changes this visit: There are no discontinued medications.  No orders of the defined types were placed in this encounter.    Assessment & Recommendations:   77 y.o.       Elder Negus, MD Pager: [phone] Office: [phone]

## 2023-06-09 ENCOUNTER — Other Ambulatory Visit: Payer: Self-pay | Admitting: Internal Medicine
